# Patient Record
Sex: Female | Born: 1960 | Race: White | Hispanic: No | Marital: Married | Smoking: Never smoker
Health system: Southern US, Community
[De-identification: ages and names within clinical notes are randomized; demographics above are authoritative.]

## PROBLEM LIST (undated history)

## (undated) DIAGNOSIS — N189 Chronic kidney disease, unspecified: Secondary | ICD-10-CM

## (undated) DIAGNOSIS — I10 Essential (primary) hypertension: Secondary | ICD-10-CM

## (undated) HISTORY — PX: ABDOMINAL HYSTERECTOMY: SHX81

## (undated) HISTORY — DX: Chronic kidney disease, unspecified: N18.9

## (undated) HISTORY — PX: TONSILLECTOMY: SUR1361

---

## 1998-08-15 ENCOUNTER — Encounter: Payer: Self-pay | Admitting: Emergency Medicine

## 1998-08-15 ENCOUNTER — Emergency Department (HOSPITAL_COMMUNITY): Admission: EM | Admit: 1998-08-15 | Discharge: 1998-08-15 | Payer: Self-pay | Admitting: Emergency Medicine

## 2001-01-07 ENCOUNTER — Other Ambulatory Visit: Admission: RE | Admit: 2001-01-07 | Discharge: 2001-01-07 | Payer: Self-pay | Admitting: *Deleted

## 2001-01-17 ENCOUNTER — Encounter (INDEPENDENT_AMBULATORY_CARE_PROVIDER_SITE_OTHER): Payer: Self-pay | Admitting: Specialist

## 2001-01-17 ENCOUNTER — Other Ambulatory Visit: Admission: RE | Admit: 2001-01-17 | Discharge: 2001-01-17 | Payer: Self-pay | Admitting: *Deleted

## 2001-05-10 ENCOUNTER — Ambulatory Visit (HOSPITAL_COMMUNITY): Admission: RE | Admit: 2001-05-10 | Discharge: 2001-05-10 | Payer: Self-pay | Admitting: Unknown Physician Specialty

## 2001-07-04 ENCOUNTER — Emergency Department (HOSPITAL_COMMUNITY): Admission: EM | Admit: 2001-07-04 | Discharge: 2001-07-04 | Payer: Self-pay | Admitting: Emergency Medicine

## 2001-07-23 ENCOUNTER — Ambulatory Visit (HOSPITAL_COMMUNITY): Admission: RE | Admit: 2001-07-23 | Discharge: 2001-07-23 | Payer: Self-pay | Admitting: *Deleted

## 2001-08-21 HISTORY — PX: CATARACT EXTRACTION, BILATERAL: SHX1313

## 2002-11-04 ENCOUNTER — Emergency Department (HOSPITAL_COMMUNITY): Admission: EM | Admit: 2002-11-04 | Discharge: 2002-11-04 | Payer: Self-pay | Admitting: Emergency Medicine

## 2002-11-04 ENCOUNTER — Encounter: Payer: Self-pay | Admitting: Emergency Medicine

## 2003-09-23 ENCOUNTER — Encounter: Admission: RE | Admit: 2003-09-23 | Discharge: 2003-09-23 | Payer: Self-pay | Admitting: Internal Medicine

## 2004-09-30 ENCOUNTER — Encounter: Admission: RE | Admit: 2004-09-30 | Discharge: 2004-09-30 | Payer: Self-pay | Admitting: Internal Medicine

## 2005-12-01 ENCOUNTER — Ambulatory Visit: Payer: Self-pay | Admitting: Internal Medicine

## 2005-12-25 ENCOUNTER — Ambulatory Visit: Payer: Self-pay | Admitting: Internal Medicine

## 2005-12-27 ENCOUNTER — Ambulatory Visit: Payer: Self-pay | Admitting: Cardiology

## 2009-05-08 ENCOUNTER — Emergency Department (HOSPITAL_COMMUNITY): Admission: EM | Admit: 2009-05-08 | Discharge: 2009-05-08 | Payer: Self-pay | Admitting: Emergency Medicine

## 2009-09-16 ENCOUNTER — Encounter: Admission: RE | Admit: 2009-09-16 | Discharge: 2009-09-16 | Payer: Self-pay | Admitting: Internal Medicine

## 2010-04-07 ENCOUNTER — Encounter: Admission: RE | Admit: 2010-04-07 | Discharge: 2010-04-07 | Payer: Self-pay | Admitting: Internal Medicine

## 2010-06-29 ENCOUNTER — Ambulatory Visit (HOSPITAL_BASED_OUTPATIENT_CLINIC_OR_DEPARTMENT_OTHER): Admission: RE | Admit: 2010-06-29 | Discharge: 2010-06-29 | Payer: Self-pay | Admitting: Orthopedic Surgery

## 2010-08-08 ENCOUNTER — Ambulatory Visit (HOSPITAL_COMMUNITY)
Admission: RE | Admit: 2010-08-08 | Discharge: 2010-08-08 | Payer: Self-pay | Source: Home / Self Care | Attending: Gastroenterology | Admitting: Gastroenterology

## 2010-08-21 HISTORY — PX: KNEE SURGERY: SHX244

## 2010-10-31 LAB — PROTIME-INR: Prothrombin Time: 13.7 seconds (ref 11.6–15.2)

## 2010-10-31 LAB — CBC
HCT: 35 % — ABNORMAL LOW (ref 36.0–46.0)
Hemoglobin: 11.7 g/dL — ABNORMAL LOW (ref 12.0–15.0)
MCH: 30.2 pg (ref 26.0–34.0)
MCHC: 33.4 g/dL (ref 30.0–36.0)
MCV: 90.2 fL (ref 78.0–100.0)

## 2010-11-01 LAB — POCT HEMOGLOBIN-HEMACUE: Hemoglobin: 12.4 g/dL (ref 12.0–15.0)

## 2011-01-06 NOTE — Procedures (Signed)
. [facility]  Patient:    [name], Crystal Green Visit Number: [id number] MRN: [id number]          Service Type: CAT Location: [facility] 2855 [id number] Attending Physician:  Darlin Priestly Dictated by:   Lenise Herald, M.D. Proc. Date: 07/23/01 Admit Date:  07/23/2001   CC:         Delrae Rend, M.D.   Procedure Report  PROCEDURE:  Heads-up tilt-table testing.  COMPLICATIONS:  None.  INDICATIONS:  Ms. Crosley is a 50 year old female patient of Dr. Yates Decamp with recurrent presyncope.  She is now referred for a tilt-table test to rule out vasodepressor syncope.  DESCRIPTION OF PROCEDURE:  After giving informed written consent, the patient was brought to the EP lab in a fasting state.  The patient then underwent eight minutes of blood pressure and heart rate monitoring with a resting blood pressure of 136/71 and a resting heart rate of 81.  This remained stable.  The patient was then tilted to a heads-up position at 70 degrees.  She remained hemodynamically stable with blood pressures ranging from 120 to 140 and heart rates running from the upper 80s to 122.  The patient did complain of intermittent episodes of nausea and mild lightheaded; however, she had no symptoms which were reminiscent of her prior presyncope.  The patient was then again placed in the supine position with hemodynamic monitoring which was essentially unchanged.  Isuprel infusion was then begun at 15 cc.  After approximately five minute of Isuprel infusion, the patient was then heads-up tilted to 70 degrees.  Isuprel was increased to 18 cc.  Approximately eight minutes into the heads-up portion, the patients blood pressure dropped to 90/60.  Heart rate dropped from 122 to 67.  The patient became pale and nauseated and felt presyncope.  The patient was then immediately returned to a supine position.  Isuprel was discontinued and her blood pressure returned to 123/49 with a heart rate of 82.   The patient then was recovered without incident.  CONCLUSIONS:  A positive Isuprel-induced heads-up tilt-table testing. Dictated by:   Lenise Herald, M.D. Attending Physician:  Darlin Priestly DD:  07/23/01 TD:  07/23/01 Job: [id number]

## 2011-12-01 ENCOUNTER — Observation Stay (HOSPITAL_COMMUNITY)
Admission: EM | Admit: 2011-12-01 | Discharge: 2011-12-03 | Disposition: A | Discharge status: Home / Self Care | Payer: BC Managed Care – PPO | Attending: General Surgery | Admitting: General Surgery

## 2011-12-01 ENCOUNTER — Emergency Department (HOSPITAL_COMMUNITY): Payer: BC Managed Care – PPO

## 2011-12-01 ENCOUNTER — Encounter (HOSPITAL_COMMUNITY): Payer: Self-pay | Admitting: *Deleted

## 2011-12-01 DIAGNOSIS — R1031 Right lower quadrant pain: Secondary | ICD-10-CM | POA: Insufficient documentation

## 2011-12-01 DIAGNOSIS — R197 Diarrhea, unspecified: Secondary | ICD-10-CM | POA: Insufficient documentation

## 2011-12-01 DIAGNOSIS — R110 Nausea: Secondary | ICD-10-CM | POA: Insufficient documentation

## 2011-12-01 DIAGNOSIS — K3580 Unspecified acute appendicitis: Principal | ICD-10-CM | POA: Insufficient documentation

## 2011-12-01 DIAGNOSIS — I10 Essential (primary) hypertension: Secondary | ICD-10-CM | POA: Insufficient documentation

## 2011-12-01 HISTORY — DX: Essential (primary) hypertension: I10

## 2011-12-01 LAB — LIPASE, BLOOD: Lipase: 19 U/L (ref 11–59)

## 2011-12-01 LAB — BASIC METABOLIC PANEL
Chloride: 104 mEq/L (ref 96–112)
GFR calc Af Amer: >90 mL/min (ref >90)
Potassium: 3.9 mEq/L (ref 3.5–5.1)

## 2011-12-01 LAB — URINALYSIS, ROUTINE W REFLEX MICROSCOPIC
Ketones, ur: 15 mg/dL — AB
Leukocytes, UA: NEGATIVE
Nitrite: NEGATIVE
Urobilinogen, UA: 0.2 mg/dL (ref 0.0–1.0)
pH: 5 (ref 5.0–8.0)

## 2011-12-01 LAB — CBC
MCH: 29.9 pg (ref 26.0–34.0)
MCV: 89.5 fL (ref 78.0–100.0)
WBC: 13.4 10*3/uL — ABNORMAL HIGH (ref 4.0–10.5)

## 2011-12-01 LAB — HEPATIC FUNCTION PANEL
AST: 20 U/L (ref 0–37)
Albumin: 4.1 g/dL (ref 3.5–5.2)
Total Bilirubin: 1 mg/dL (ref 0.3–1.2)

## 2011-12-01 MED ORDER — SODIUM CHLORIDE 0.9 % IV SOLN
Freq: Once | INTRAVENOUS | Status: AC
  Administered 2011-12-01: 20:00:00 via INTRAVENOUS

## 2011-12-01 MED ORDER — IOHEXOL 300 MG/ML  SOLN
20.0000 mL | INTRAMUSCULAR | Status: AC
  Administered 2011-12-01: 20 mL via ORAL

## 2011-12-01 MED ORDER — SODIUM CHLORIDE 0.9 % IV SOLN
1.0000 g | Freq: Once | INTRAVENOUS | Status: AC
  Administered 2011-12-02: 1 g via INTRAVENOUS
  Filled 2011-12-01: qty 1

## 2011-12-01 MED ORDER — ONDANSETRON HCL 4 MG/2ML IJ SOLN
4.0000 mg | Freq: Once | INTRAMUSCULAR | Status: AC
  Administered 2011-12-01: 4 mg via INTRAVENOUS
  Filled 2011-12-01: qty 2

## 2011-12-01 MED ORDER — HYDROMORPHONE HCL PF 1 MG/ML IJ SOLN
1.0000 mg | Freq: Once | INTRAMUSCULAR | Status: AC
  Administered 2011-12-01: 0.75 mg via INTRAVENOUS
  Filled 2011-12-01: qty 1

## 2011-12-01 MED ORDER — IOHEXOL 300 MG/ML  SOLN
100.0000 mL | Freq: Once | INTRAMUSCULAR | Status: AC | PRN
  Administered 2011-12-01: 100 mL via INTRAVENOUS

## 2011-12-01 NOTE — ED Provider Notes (Signed)
History     CSN: [id number]  Arrival date & time 12/01/11  1441   First MD Initiated Contact with Patient 12/01/11 1929      Chief Complaint  Patient presents with  . Abdominal Pain  . Nausea  . Diarrhea    (Consider location/radiation/quality/duration/timing/severity/associated sxs/prior treatment) Patient is a 51 y.o. female presenting with abdominal pain and diarrhea. The history is provided by the patient (The patient states she started with diarrhea on Wednesday was continued on Thursday and today she started having some abdominal pain no fevers chills some nausea the pain is worse in the right lower quadrant). No language interpreter was used.  Abdominal Pain The primary symptoms of the illness include abdominal pain and diarrhea. The primary symptoms of the illness do not include fatigue. The current episode started 2 days ago. The onset of the illness was gradual. The problem has not changed since onset. Associated with: none. The patient states that she believes she is currently not pregnant. The patient has not had a change in bowel habit. Additional symptoms associated with the illness include chills. Symptoms associated with the illness do not include hematuria, frequency or back pain. Significant associated medical issues do not include PUD, inflammatory bowel disease or sickle cell disease.  Diarrhea The primary symptoms include abdominal pain and diarrhea. Primary symptoms do not include fatigue or rash. The illness began 2 days ago. The problem has not changed since onset. The illness is also significant for chills. The illness does not include back pain. Associated medical issues do not include inflammatory bowel disease or PUD.    Past Medical History  Diagnosis Date  . Hypertension     Past Surgical History  Procedure Date  . Abdominal hysterectomy     History reviewed. No pertinent family history.  History  Substance Use Topics  . Smoking status: Never  Smoker   . Smokeless tobacco: Not on file  . Alcohol Use: No    OB History    Grav Para Term Preterm Abortions TAB SAB Ect Mult Living                  Review of Systems  Constitutional: Positive for chills. Negative for fatigue.  HENT: Negative for congestion, sinus pressure and ear discharge.   Eyes: Negative for discharge.  Respiratory: Negative for cough.   Cardiovascular: Negative for chest pain.  Gastrointestinal: Positive for abdominal pain and diarrhea.  Genitourinary: Negative for frequency and hematuria.  Musculoskeletal: Negative for back pain.  Skin: Negative for rash.  Neurological: Negative for seizures and headaches.  Hematological: Negative.   Psychiatric/Behavioral: Negative for hallucinations.    Allergies  Codeine; Sudafed; and Amoxicillin  Home Medications   Current Outpatient Rx  Name Route Sig Dispense Refill  . CLARITIN PO Oral Take 1 tablet by mouth daily as needed. For allergies childrens claritin    . METOPROLOL SUCCINATE ER 25 MG PO TB24 Oral Take 37.5 mg by mouth daily.      BP 128/83  Pulse 78  Temp(Src) 98.6 F (37 C) (Oral)  Resp 16  SpO2 96%  Physical Exam  Constitutional: She is oriented to person, place, and time. She appears well-developed.  HENT:  Head: Normocephalic and atraumatic.  Eyes: Conjunctivae and EOM are normal. No scleral icterus.  Neck: Neck supple. No thyromegaly present.  Cardiovascular: Normal rate and regular rhythm.  Exam reveals no gallop and no friction rub.   No murmur heard. Pulmonary/Chest: No stridor. She has no wheezes.  She has no rales. She exhibits no tenderness.  Abdominal: She exhibits no distension. There is tenderness. There is no rebound.       Tender rlq  Musculoskeletal: Normal range of motion. She exhibits no edema.  Lymphadenopathy:    She has no cervical adenopathy.  Neurological: She is oriented to person, place, and time. Coordination normal.  Skin: No rash noted. No erythema.    Psychiatric: She has a normal mood and affect. Her behavior is normal.    ED Course  Procedures (including critical care time)  Labs Reviewed  BASIC METABOLIC PANEL - Abnormal; Notable for the following:    Glucose, Bld 122 (*)    All other components within normal limits  CBC - Abnormal; Notable for the following:    WBC 13.4 (*)    All other components within normal limits  URINALYSIS, ROUTINE W REFLEX MICROSCOPIC - Abnormal; Notable for the following:    Color, Urine AMBER (*) BIOCHEMICALS MAY BE AFFECTED BY COLOR   APPearance CLOUDY (*)    Ketones, ur 15 (*)    All other components within normal limits  LIPASE, BLOOD   No results found.   No diagnosis found.    MDM          Benny Lennert, MD 12/01/11 [phone]

## 2011-12-01 NOTE — ED Provider Notes (Signed)
She has been ill today with pain in the right lower abdomen, it worsened when she exercised on a treadmill. She's had decreased appetite and malaise for 3 days. She's had some fever and chills, but no documented fever. She has not had similar problem. 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 She has had a transvaginal hysterectomy. Abdomen is soft with mild right lower quadrant tenderness.  Diagnosis: uncomplicated appendicitis.  Consult general surgery for, admission and appendectomy   Medical screening examination/treatment/procedure(s) were conducted as a shared visit with non-physician practitioner(s) and myself.  I personally evaluated the patient during the encounter  Flint Melter, MD 12/02/11 [phone]

## 2011-12-01 NOTE — ED Provider Notes (Signed)
History     CSN: [id number]  Arrival date & time 12/01/11  1441   First MD Initiated Contact with Patient 12/01/11 1929      Chief Complaint  Patient presents with  . Abdominal Pain  . Nausea  . Diarrhea    (Consider location/radiation/quality/duration/timing/severity/associated sxs/prior treatment) HPI  Past Medical History  Diagnosis Date  . Hypertension     Past Surgical History  Procedure Date  . Abdominal hysterectomy     History reviewed. No pertinent family history.  History  Substance Use Topics  . Smoking status: Never Smoker   . Smokeless tobacco: Not on file  . Alcohol Use: No    OB History    Grav Para Term Preterm Abortions TAB SAB Ect Mult Living                  Review of Systems  Allergies  Codeine; Sudafed; and Amoxicillin  Home Medications   Current Outpatient Rx  Name Route Sig Dispense Refill  . CLARITIN PO Oral Take 1 tablet by mouth daily as needed. For allergies childrens claritin    . METOPROLOL SUCCINATE ER 25 MG PO TB24 Oral Take 37.5 mg by mouth daily.      BP 128/83  Pulse 78  Temp(Src) 98.6 F (37 C) (Oral)  Resp 16  SpO2 96%  Physical Exam  ED Course  Procedures (including critical care time)  Labs Reviewed  BASIC METABOLIC PANEL - Abnormal; Notable for the following:    Glucose, Bld 122 (*)    All other components within normal limits  CBC - Abnormal; Notable for the following:    WBC 13.4 (*)    All other components within normal limits  URINALYSIS, ROUTINE W REFLEX MICROSCOPIC - Abnormal; Notable for the following:    Color, Urine AMBER (*) BIOCHEMICALS MAY BE AFFECTED BY COLOR   APPearance CLOUDY (*)    Ketones, ur 15 (*)    All other components within normal limits  LIPASE, BLOOD  HEPATIC FUNCTION PANEL   Ct Abdomen Pelvis W Contrast  12/01/2011  *RADIOLOGY REPORT*  Clinical Data: Abdominal pain.  Nausea.  Diarrhea.  CT ABDOMEN AND PELVIS WITH CONTRAST  Technique:  Multidetector CT imaging of the  abdomen and pelvis was performed following the standard protocol during bolus administration of intravenous contrast.  Contrast: OMNIPAQUE IOHEXOL 300 MG/ML  SOLN  Comparison: None.  Findings: The abdominal parenchymal organs are normal appearance. Prior hysterectomy noted.  No masses or lymphadenopathy identified within the abdomen or pelvis.  Enlarged of the appendix is seen with mild periappendiceal inflammatory changes.  This consistent with acute appendicitis. There is no evidence of abscess or free fluid.  No evidence of bowel obstruction or other inflammatory process.  IMPRESSION:  1.  Positive for acute appendicitis. 2.  No evidence of abscess or other complication.  Original Report Authenticated By: Danae Orleans, M.D.     No diagnosis found.    MDM  I have taken over care of the patient from Dr. Estell Harpin - please see his note for HPI, ROS and PE - patient with acute appendicitis - Dr. Effie Shy in to speak with the patient and he will call general surgery, Patient with a history of elevated LFT's, so we have ordered these as well.        Crystal Green, [place] 12/01/11 [phone]

## 2011-12-01 NOTE — ED Notes (Signed)
Pt states started having abdominal pain with nausea and diarrhea on wednesday.Pt points to upper abdomen and lower abdomen. Pt was sent to ED from urgent care on [address] for further eval.

## 2011-12-02 ENCOUNTER — Emergency Department (HOSPITAL_COMMUNITY): Payer: BC Managed Care – PPO | Admitting: Certified Registered"

## 2011-12-02 ENCOUNTER — Encounter (HOSPITAL_COMMUNITY): Payer: Self-pay | Admitting: Certified Registered"

## 2011-12-02 ENCOUNTER — Encounter (HOSPITAL_COMMUNITY)
Admission: EM | Disposition: A | Discharge status: Home / Self Care | Payer: Self-pay | Source: Home / Self Care | Attending: Emergency Medicine

## 2011-12-02 DIAGNOSIS — K3580 Unspecified acute appendicitis: Secondary | ICD-10-CM | POA: Diagnosis present

## 2011-12-02 HISTORY — PX: LAPAROSCOPIC APPENDECTOMY: SHX408

## 2011-12-02 SURGERY — APPENDECTOMY, LAPAROSCOPIC
Anesthesia: General | Site: Abdomen | Wound class: Contaminated

## 2011-12-02 MED ORDER — HYDROMORPHONE HCL PF 1 MG/ML IJ SOLN
1.0000 mg | Freq: Once | INTRAMUSCULAR | Status: AC
  Administered 2011-12-02: 1 mg via INTRAVENOUS
  Filled 2011-12-02: qty 1

## 2011-12-02 MED ORDER — SODIUM CHLORIDE 0.9 % IR SOLN
Status: DC | PRN
  Administered 2011-12-02: 1000 mL

## 2011-12-02 MED ORDER — ONDANSETRON HCL 4 MG/2ML IJ SOLN: 4.0000 mg | Freq: Once | INTRAMUSCULAR | Status: DC | PRN

## 2011-12-02 MED ORDER — METOCLOPRAMIDE HCL 5 MG/ML IJ SOLN
INTRAMUSCULAR | Status: DC | PRN
  Administered 2011-12-02: 10 mg via INTRAVENOUS

## 2011-12-02 MED ORDER — SUCCINYLCHOLINE CHLORIDE 20 MG/ML IJ SOLN
INTRAMUSCULAR | Status: DC | PRN
  Administered 2011-12-02: 100 mg via INTRAVENOUS

## 2011-12-02 MED ORDER — ACETAMINOPHEN 650 MG RE SUPP: 650.0000 mg | Freq: Four times a day (QID) | RECTAL | Status: DC | PRN

## 2011-12-02 MED ORDER — DROPERIDOL 2.5 MG/ML IJ SOLN
INTRAMUSCULAR | Status: DC | PRN
  Administered 2011-12-02: 0.625 mg via INTRAVENOUS

## 2011-12-02 MED ORDER — OXYCODONE HCL 5 MG PO TABS
5.0000 mg | ORAL_TABLET | ORAL | Status: DC | PRN
  Administered 2011-12-02: 5 mg via ORAL
  Filled 2011-12-02: qty 1

## 2011-12-02 MED ORDER — LORATADINE 10 MG PO TABS
10.0000 mg | ORAL_TABLET | Freq: Every day | ORAL | Status: DC
  Filled 2011-12-02 (×2): qty 1

## 2011-12-02 MED ORDER — SODIUM CHLORIDE 0.9 % IV SOLN
INTRAVENOUS | Status: DC
  Administered 2011-12-02 (×2): via INTRAVENOUS

## 2011-12-02 MED ORDER — 0.9 % SODIUM CHLORIDE (POUR BTL) OPTIME
TOPICAL | Status: DC | PRN
  Administered 2011-12-02: 1000 mL

## 2011-12-02 MED ORDER — FENTANYL CITRATE 0.05 MG/ML IJ SOLN
INTRAMUSCULAR | Status: DC | PRN
  Administered 2011-12-02: 50 ug via INTRAVENOUS
  Administered 2011-12-02 (×2): 100 ug via INTRAVENOUS

## 2011-12-02 MED ORDER — LACTATED RINGERS IV SOLN
INTRAVENOUS | Status: DC | PRN
  Administered 2011-12-02: 02:00:00 via INTRAVENOUS

## 2011-12-02 MED ORDER — ACETAMINOPHEN 325 MG PO TABS: 650.0000 mg | ORAL_TABLET | Freq: Four times a day (QID) | ORAL | Status: DC | PRN

## 2011-12-02 MED ORDER — ONDANSETRON HCL 4 MG/2ML IJ SOLN
4.0000 mg | Freq: Once | INTRAMUSCULAR | Status: AC
  Administered 2011-12-02: 4 mg via INTRAVENOUS
  Filled 2011-12-02: qty 2

## 2011-12-02 MED ORDER — MIDAZOLAM HCL 5 MG/5ML IJ SOLN
INTRAMUSCULAR | Status: DC | PRN
  Administered 2011-12-02: 2 mg via INTRAVENOUS

## 2011-12-02 MED ORDER — METOPROLOL SUCCINATE ER 25 MG PO TB24
37.5000 mg | ORAL_TABLET | Freq: Every day | ORAL | Status: DC
  Administered 2011-12-02: 37.5 mg via ORAL
  Filled 2011-12-02 (×2): qty 1

## 2011-12-02 MED ORDER — PROPOFOL 10 MG/ML IV EMUL
INTRAVENOUS | Status: DC | PRN
  Administered 2011-12-02: 200 mg via INTRAVENOUS
  Administered 2011-12-02: 100 mg via INTRAVENOUS

## 2011-12-02 MED ORDER — HYDROCODONE-ACETAMINOPHEN 5-325 MG PO TABS: 1.0000 | ORAL_TABLET | ORAL | Status: DC | PRN

## 2011-12-02 MED ORDER — ONDANSETRON HCL 4 MG/2ML IJ SOLN
INTRAMUSCULAR | Status: DC | PRN
  Administered 2011-12-02: 4 mg via INTRAVENOUS

## 2011-12-02 MED ORDER — HYDROMORPHONE HCL PF 1 MG/ML IJ SOLN: 0.2500 mg | INTRAMUSCULAR | Status: DC | PRN

## 2011-12-02 MED ORDER — MORPHINE SULFATE 2 MG/ML IJ SOLN: 0.0500 mg/kg | INTRAMUSCULAR | Status: DC | PRN

## 2011-12-02 MED ORDER — ONDANSETRON HCL 4 MG/2ML IJ SOLN: 4.0000 mg | Freq: Four times a day (QID) | INTRAMUSCULAR | Status: DC | PRN

## 2011-12-02 MED ORDER — MORPHINE SULFATE 2 MG/ML IJ SOLN
2.0000 mg | INTRAMUSCULAR | Status: DC | PRN
  Administered 2011-12-02: 2 mg via INTRAVENOUS
  Filled 2011-12-02 (×2): qty 1

## 2011-12-02 MED ORDER — BUPIVACAINE-EPINEPHRINE 0.25% -1:200000 IJ SOLN
INTRAMUSCULAR | Status: DC | PRN
  Administered 2011-12-02: 1 mL

## 2011-12-02 MED ORDER — OXYCODONE HCL 5 MG PO TABS
5.0000 mg | ORAL_TABLET | ORAL | Status: DC | PRN
  Administered 2011-12-02 – 2011-12-03 (×4): 5 mg via ORAL
  Filled 2011-12-02: qty 2
  Filled 2011-12-02 (×3): qty 1

## 2011-12-02 SURGICAL SUPPLY — 45 items
APPLIER CLIP ROT 10 11.4 M/L (STAPLE)
CANISTER SUCTION 2500CC (MISCELLANEOUS) ×2 IMPLANT
CHLORAPREP W/TINT 26ML (MISCELLANEOUS) ×2 IMPLANT
CLIP APPLIE ROT 10 11.4 M/L (STAPLE) IMPLANT
CLOTH BEACON ORANGE TIMEOUT ST (SAFETY) ×2 IMPLANT
COVER SURGICAL LIGHT HANDLE (MISCELLANEOUS) ×2 IMPLANT
CUTTER FLEX LINEAR 45M (STAPLE) ×2 IMPLANT
DERMABOND ADVANCED (GAUZE/BANDAGES/DRESSINGS) ×1
DERMABOND ADVANCED .7 DNX12 (GAUZE/BANDAGES/DRESSINGS) ×1 IMPLANT
ELECT REM PT RETURN 9FT ADLT (ELECTROSURGICAL) ×2
ELECTRODE REM PT RTRN 9FT ADLT (ELECTROSURGICAL) ×1 IMPLANT
GLOVE BIO SURGEON STRL SZ 6.5 (GLOVE) ×2 IMPLANT
GLOVE BIO SURGEON STRL SZ7 (GLOVE) ×2 IMPLANT
GLOVE BIOGEL PI IND STRL 6.5 (GLOVE) ×1 IMPLANT
GLOVE BIOGEL PI IND STRL 7.5 (GLOVE) ×1 IMPLANT
GLOVE BIOGEL PI INDICATOR 6.5 (GLOVE) ×1
GLOVE BIOGEL PI INDICATOR 7.5 (GLOVE) ×1
GLOVE SS BIOGEL STRL SZ 7 (GLOVE) ×1 IMPLANT
GLOVE SUPERSENSE BIOGEL SZ 7 (GLOVE) ×1
GOWN STRL NON-REIN LRG LVL3 (GOWN DISPOSABLE) ×4 IMPLANT
KIT BASIN OR (CUSTOM PROCEDURE TRAY) ×2 IMPLANT
KIT ROOM TURNOVER OR (KITS) ×2 IMPLANT
NS IRRIG 1000ML POUR BTL (IV SOLUTION) ×2 IMPLANT
PAD ARMBOARD 7.5X6 YLW CONV (MISCELLANEOUS) ×4 IMPLANT
POUCH SPECIMEN RETRIEVAL 10MM (ENDOMECHANICALS) ×2 IMPLANT
RELOAD 45 VASCULAR/THIN (ENDOMECHANICALS) ×2 IMPLANT
RELOAD STAPLE TA45 3.5 REG BLU (ENDOMECHANICALS) IMPLANT
SCALPEL HARMONIC ACE (MISCELLANEOUS) ×2 IMPLANT
SCISSORS LAP 5X35 DISP (ENDOMECHANICALS) IMPLANT
SET IRRIG TUBING LAPAROSCOPIC (IRRIGATION / IRRIGATOR) ×2 IMPLANT
SLEEVE ENDOPATH XCEL 5M (ENDOMECHANICALS) ×2 IMPLANT
SPECIMEN JAR MEDIUM (MISCELLANEOUS) ×2 IMPLANT
SPECIMEN JAR SMALL (MISCELLANEOUS) IMPLANT
SPONGE LAP 18X18 X RAY DECT (DISPOSABLE) ×2 IMPLANT
SUT MNCRL AB 4-0 PS2 18 (SUTURE) ×2 IMPLANT
SUT VICRYL 0 UR6 27IN ABS (SUTURE) ×2 IMPLANT
TOWEL OR 17X24 6PK STRL BLUE (TOWEL DISPOSABLE) ×2 IMPLANT
TOWEL OR 17X26 10 PK STRL BLUE (TOWEL DISPOSABLE) ×2 IMPLANT
TRAY FOLEY CATH 14FR (SET/KITS/TRAYS/PACK) ×2 IMPLANT
TRAY LAPAROSCOPIC (CUSTOM PROCEDURE TRAY) ×2 IMPLANT
TROCAR XCEL 12X100 BLDLESS (ENDOMECHANICALS) IMPLANT
TROCAR XCEL BLADELESS 5X75MML (TROCAR) ×2 IMPLANT
TROCAR XCEL BLUNT TIP 100MML (ENDOMECHANICALS) ×2 IMPLANT
TROCAR XCEL NON-BLD 11X100MML (ENDOMECHANICALS) IMPLANT
TROCAR XCEL NON-BLD 5MMX100MML (ENDOMECHANICALS) ×2 IMPLANT

## 2011-12-02 NOTE — Op Note (Signed)
Preoperative diagnosis: Acute appendicitis Postoperative diagnosis: Acute suppurative appendicitis Procedure: Laparoscopic appendectomy Surgeon: Dr. Harden Mo Anesthesia: Gen. Specimens: Appendix to pathology Complications: None Estimated blood loss: Minimal Drains: None Sponge and needle count correct x2 at end of operation Disposition the patient to recovery in stable condition  Indications: This is a 51 year old female with a 48-hour history of right lower quadrant abdominal pain associated with an elevated white blood cell count, localized peritoneal signs on exam and a right lower quadrant, and a CT scan consistent with acute appendicitis. I discussed laparoscopic appendectomy and the risks and benefits associated with that.  Procedure: After informed consent was the patient was taken to improve. She was administered 1 g of intravenous invanz. Sequential compression devices were placed her legs prior to induction of anesthesia. She was placed under general endotracheal anesthesia without complication. A Foley catheter was then placed. Her abdomen was prepped and draped in the standard sterile surgical fashion. Surgical timeout was performed.  I injected quarter percent Marcaine just below her umbilicus. A  vertical incision was made and I carried this out to her fascia. I incised the fascia sharply and entered the peritoneum bluntly. I placed a 0 Vicryl purse string suture through the fascia. A Hassan trocar was then introduced and the abdomen was then insufflated to 15 mmHg pressure. I then placed 2 additional 5 mm ports after infiltration with local anesthetic under direct vision without complication in the suprapubic region and left lower quadrant. I then dissected over to the right lower quadrant. Her appendix was noted to be acutely suppurative.It was here adherent to her terminal ileum freed this with blunt dissection. I then released the white line of Toldt up the little bit of the  cecum to release the appendix. I did this with the Harmonic scalpel. I then began to divide the mesentery of the appendix with harmonic scalpel. Eventually I was able to encircle the base with the [facility]. I then stapled across the base. This was hemostatic and clean viable tissue. I then used a Harmonic scalpel to remove the remaining attachments of the appendix. This was placed in an Endo Catch bag and removed from the umbilicus. Irrigation was performed. Hemostasis was observed. I then room a Hassan trocar placed an additional figure-of-eight 0 Vicryl suture through this defect and this completely obliterated. I then desufflated the abdomen and removed the remaining trocars. I closed the incisions with 4-0 Monocryl and Dermabond. She tolerated this well her Foley was removed and was extubated in the operating room. She was transferred to recovery in stable condition.

## 2011-12-02 NOTE — ED Provider Notes (Signed)
Medical screening examination/treatment/procedure(s) were conducted as a shared visit with non-physician practitioner(s) and myself.  I personally evaluated the patient during the encounter  Flint Melter, MD 12/02/11 [phone]

## 2011-12-02 NOTE — Anesthesia Postprocedure Evaluation (Signed)
  Anesthesia Post-op Note  Patient: Crystal Green  Procedure(s) Performed: Procedure(s) (LRB): APPENDECTOMY LAPAROSCOPIC (N/A)  Patient Location: PACU  Anesthesia Type: General  Level of Consciousness: awake, alert  and oriented  Airway and Oxygen Therapy: Patient Spontanous Breathing  Post-op Pain: mild  Post-op Assessment: Post-op Vital signs reviewed  Post-op Vital Signs: Reviewed  Complications: No apparent anesthesia complications

## 2011-12-02 NOTE — ED Notes (Signed)
Assumed care of pt.  Pt noted to ambulate to bathroom without difficulty.  Reporting slight pain in her RLQ.  No distress noted.  Family remains at bedside-awaiting surgery consult.

## 2011-12-02 NOTE — Anesthesia Procedure Notes (Signed)
Procedure Name: Intubation Date/Time: 12/02/2011 2:41 AM Performed by: Rossie Muskrat L Pre-anesthesia Checklist: Patient identified, Timeout performed, Emergency Drugs available, Suction available and Patient being monitored Patient Re-evaluated:Patient Re-evaluated prior to inductionOxygen Delivery Method: Circle system utilized Preoxygenation: Pre-oxygenation with 100% oxygen Intubation Type: IV induction Ventilation: Mask ventilation without difficulty Laryngoscope Size: Miller and 2 Grade View: Grade I Tube type: Oral Tube size: 7.5 mm Number of attempts: 1 Airway Equipment and Method: Stylet Placement Confirmation: ETT inserted through vocal cords under direct vision,  breath sounds checked- equal and bilateral and positive ETCO2 Secured at: 22 cm Tube secured with: Tape Dental Injury: Teeth and Oropharynx as per pre-operative assessment

## 2011-12-02 NOTE — Progress Notes (Signed)
Day of Surgery  Subjective: S/p lap appy Sleepy, but no serious complaints No nausea, tolerating clears  Objective: Vital signs in last 24 hours: Temp:  [97.9 F (36.6 C)-98.9 F (37.2 C)] 98.6 F (37 C) (04/13 1020) Pulse Rate:  [72-82] 82  (04/13 1020) Resp:  [16-18] 16  (04/13 1020) BP: (127-142)/(68-88) 127/68 mmHg (04/13 1020) SpO2:  [96 %-100 %] 100 % (04/13 1020) Weight:  [239 lb 3.2 oz (108.5 kg)] 239 lb 3.2 oz (108.5 kg) (04/13 0500) Last BM Date: 11/30/11  Intake/Output from previous day: 04/12 0701 - 04/13 0700 In: 1000 [I.V.:1000] Out: 300 [Urine:200; Blood:100] Intake/Output this shift: Total I/O In: 360 [P.O.:360] Out: 402 [Urine:402]  Abd - soft,incisions clean and dry; positive BS  Lab Results:   [facility] 12/01/11 1514  WBC 13.4*  HGB 12.8  HCT 38.3  PLT 208   BMET  Basename 12/01/11 1514  NA 140  K 3.9  CL 104  CO2 23  GLUCOSE 122*  BUN 15  CREATININE 0.79  CALCIUM 9.2   PT/INR No results found for this basename: LABPROT:2,INR:2 in the last 72 hours ABG No results found for this basename: PHART:2,PCO2:2,PO2:2,HCO3:2 in the last 72 hours  Studies/Results: Ct Abdomen Pelvis W Contrast  12/01/2011  *RADIOLOGY REPORT*  Clinical Data: Abdominal pain.  Nausea.  Diarrhea.  CT ABDOMEN AND PELVIS WITH CONTRAST  Technique:  Multidetector CT imaging of the abdomen and pelvis was performed following the standard protocol during bolus administration of intravenous contrast.  Contrast: OMNIPAQUE IOHEXOL 300 MG/ML  SOLN  Comparison: None.  Findings: The abdominal parenchymal organs are normal appearance. Prior hysterectomy noted.  No masses or lymphadenopathy identified within the abdomen or pelvis.  Enlarged of the appendix is seen with mild periappendiceal inflammatory changes.  This consistent with acute appendicitis. There is no evidence of abscess or free fluid.  No evidence of bowel obstruction or other inflammatory process.  IMPRESSION:  1.   Positive for acute appendicitis. 2.  No evidence of abscess or other complication.  Original Report Authenticated By: Danae Orleans, M.D.    Anti-infectives: Anti-infectives     Start     Dose/Rate Route Frequency Ordered Stop   12/01/11 2345   ertapenem (INVANZ) 1 g in sodium chloride 0.9 % 50 mL IVPB        1 g 100 mL/hr over 30 Minutes Intravenous  Once 12/01/11 2341 12/02/11 0127          Assessment/Plan: s/p Procedure(s) (LRB): APPENDECTOMY LAPAROSCOPIC (N/A) Plan for discharge tomorrow PO pain meds Advance diet   LOS: 1 day    Solina Heron K. 12/02/2011

## 2011-12-02 NOTE — Discharge Instructions (Signed)
[facility] LAPAROSCOPIC SURGERY: POST OP INSTRUCTIONS  Always review your discharge instruction sheet given to you by the facility where your surgery was performed. IF YOU HAVE DISABILITY OR FAMILY LEAVE FORMS, YOU MUST BRING THEM TO THE OFFICE FOR PROCESSING.   DO NOT GIVE THEM TO YOUR DOCTOR.  1. A prescription for pain medication may be given to you upon discharge.  Take your pain medication as prescribed, if needed.  If narcotic pain medicine is not needed, then you may take acetaminophen (Tylenol), naprosyn (Alleve), or ibuprofen (Advil) as needed. 2. Take your usually prescribed medications unless otherwise directed. 3. If you need a refill on your pain medication, please contact your pharmacy.  They will contact our office to request authorization. Prescriptions will not be filled after 5pm or on week-ends. 4. You should follow a light diet the first few days after arrival home, such as soup and crackers, etc.  Be sure to include lots of fluids daily. 5. Most patients will experience some swelling and bruising in the area of the incisions.  Ice packs will help.  Swelling and bruising can take several days to resolve.  6. It is common to experience some constipation if taking pain medication after surgery.  Increasing fluid intake and taking a stool softener (such as Colace) will usually help or prevent this problem from occurring.  A mild laxative (Milk of Magnesia or Miralax) should be taken according to package instructions if there are no bowel movements after 48 hours. 7. Unless discharge instructions indicate otherwise, you may remove your bandages 48 hours after surgery, and you may shower at that time.  You may have steri-strips (small skin tapes) in place directly over the incision.  These strips should be left on the skin for 7-10 days.  If your surgeon used skin glue on the incision, you may shower in 24 hours.  The glue will flake  off over the next 2-3 weeks.  Any sutures or staples will be removed at the office during your follow-up visit. 8. ACTIVITIES:  You may resume regular (light) daily activities beginning the next day--such as daily self-care, walking, climbing stairs--gradually increasing activities as tolerated.  You may have sexual intercourse when it is comfortable.  Refrain from any heavy lifting or straining until approved by your doctor. a. You may drive when you are no longer taking prescription pain medication, you can comfortably wear a seatbelt, and you can safely maneuver your car and apply brakes. b. RETURN TO WORK:  __________________________________________________________ 9. You should see your doctor in the office for a follow-up appointment approximately 2-3 weeks after your surgery.  Make sure that you call for this appointment within a day or two after you arrive home to insure a convenient appointment time. 10. OTHER INSTRUCTIONS: __________________________________________________________________________________________________________________________ __________________________________________________________________________________________________________________________ WHEN TO CALL YOUR DOCTOR: 1. Fever over 101.0 2. Inability to urinate 3. Continued bleeding from incision. 4. Increased pain, redness, or drainage from the incision. 5. Increasing abdominal pain  The clinic staff is available to answer your questions during regular business hours.  Please don't hesitate to call and ask to speak to one of the nurses for clinical concerns.  If you have a medical emergency, go to the nearest emergency room or call 911.  A surgeon from [facility] is always on call at the hospital. [address], [address], [place], [place]  [address] ? [address] [address], , [place]   [address] [phone] ? [phone] ? [phone]   [phone] Web site: [email or web address]  

## 2011-12-02 NOTE — Preoperative (Signed)
Beta Blockers   Reason not to administer Beta Blockers:Pt took b BLOCKER 12/01/11 @ 0800

## 2011-12-02 NOTE — H&P (Signed)
Crystal Green is an 51 y.o. female.   Chief Complaint: Referred by Dr. Mancel Bale HPI:  75 yof with 48 hour history of upper abdominal pain and rlq pain that is now mostly present in RLQ.  Denies fevers.  Has been having chills. Nauseated but no vomiting.  Having some diarrhea.  Pain is worsening and led her to come in today as she was getting no relief.  It is aggravated by movement.   Past Medical History  Diagnosis Date  . Hypertension   abnormal lfts being followed for now  Past Surgical History  Procedure Date  . Abdominal hysterectomy   this was actually a TVH by her report  History reviewed. No pertinent family history. Social History:  reports that she has never smoked. She does not have any smokeless tobacco history on file. She reports that she does not drink alcohol. Her drug history not on file.  Allergies:  Allergies  Allergen Reactions  . Codeine Other (See Comments)    jittery  . Sudafed (Pseudoephedrine) Other (See Comments)    jittery  . Amoxicillin Rash    Medications Prior to Admission  Medication Dose Route Frequency Provider Last Rate Last Dose  . 0.9 %  sodium chloride infusion   Intravenous Once Benny Lennert, MD 100 mL/hr at 12/01/11 2027    . ertapenem (INVANZ) 1 g in sodium chloride 0.9 % 50 mL IVPB  1 g Intravenous Once Flint Melter, MD      . HYDROmorphone (DILAUDID) injection 1 mg  1 mg Intravenous Once Benny Lennert, MD   0.75 mg at 12/01/11 2029  . HYDROmorphone (DILAUDID) injection 1 mg  1 mg Intravenous Once Scarlette Calico C. [place], [place]   1 mg at 12/02/11 0057  . iohexol (OMNIPAQUE) 300 MG/ML solution 100 mL  100 mL Intravenous Once PRN Benny Lennert, MD   100 mL at 12/01/11 2229  . iohexol (OMNIPAQUE) 300 MG/ML solution 20 mL  20 mL Oral Q1 Hr x 2 Benny Lennert, MD   20 mL at 12/01/11 2047  . ondansetron (ZOFRAN) injection 4 mg  4 mg Intravenous Once Benny Lennert, MD   4 mg at 12/01/11 2027  . ondansetron (ZOFRAN) injection 4 mg  4 mg  Intravenous Once Scarlette Calico C. [place], [place]   4 mg at 12/02/11 0057   No current outpatient prescriptions on file as of 12/01/2011.    Results for orders placed during the hospital encounter of 12/01/11 (from the past 48 hour(s))  BASIC METABOLIC PANEL     Status: Abnormal   Collection Time   12/01/11  3:14 PM      Component Value Range Comment   Sodium 140  135 - 145 (mEq/L)    Potassium 3.9  3.5 - 5.1 (mEq/L)    Chloride 104  96 - 112 (mEq/L)    CO2 23  19 - 32 (mEq/L)    Glucose, Bld 122 (*) 70 - 99 (mg/dL)    BUN 15  6 - 23 (mg/dL)    Creatinine, Ser 1.61  0.50 - 1.10 (mg/dL)    Calcium 9.2  8.4 - 10.5 (mg/dL)    GFR calc non Af Amer >90  >90 (mL/min)    GFR calc Af Amer >90  >90 (mL/min)   LIPASE, BLOOD     Status: Normal   Collection Time   12/01/11  3:14 PM      Component Value Range Comment   Lipase 19  11 - 59 (U/L)   CBC     Status: Abnormal   Collection Time   12/01/11  3:14 PM      Component Value Range Comment   WBC 13.4 (*) 4.0 - 10.5 (K/uL)    RBC 4.28  3.87 - 5.11 (MIL/uL)    Hemoglobin 12.8  12.0 - 15.0 (g/dL)    HCT 11.9  14.7 - 82.9 (%)    MCV 89.5  78.0 - 100.0 (fL)    MCH 29.9  26.0 - 34.0 (pg)    MCHC 33.4  30.0 - 36.0 (g/dL)    RDW 56.2  13.0 - 86.5 (%)    Platelets 208  150 - 400 (K/uL)   HEPATIC FUNCTION PANEL     Status: Normal   Collection Time   12/01/11  3:14 PM      Component Value Range Comment   Total Protein 7.4  6.0 - 8.3 (g/dL)    Albumin 4.1  3.5 - 5.2 (g/dL)    AST 20  0 - 37 (U/L)    ALT 15  0 - 35 (U/L)    Alkaline Phosphatase 114  39 - 117 (U/L)    Total Bilirubin 1.0  0.3 - 1.2 (mg/dL)    Bilirubin, Direct 0.2  0.0 - 0.3 (mg/dL)    Indirect Bilirubin 0.8  0.3 - 0.9 (mg/dL)   URINALYSIS, ROUTINE W REFLEX MICROSCOPIC     Status: Abnormal   Collection Time   12/01/11  6:12 PM      Component Value Range Comment   Color, Urine AMBER (*) YELLOW  BIOCHEMICALS MAY BE AFFECTED BY COLOR   APPearance CLOUDY (*) CLEAR     Specific Gravity,  Urine 1.030  1.005 - 1.030     pH 5.0  5.0 - 8.0     Glucose, UA NEGATIVE  NEGATIVE (mg/dL)    Hgb urine dipstick NEGATIVE  NEGATIVE     Bilirubin Urine NEGATIVE  NEGATIVE     Ketones, ur 15 (*) NEGATIVE (mg/dL)    Protein, ur NEGATIVE  NEGATIVE (mg/dL)    Urobilinogen, UA 0.2  0.0 - 1.0 (mg/dL)    Nitrite NEGATIVE  NEGATIVE     Leukocytes, UA NEGATIVE  NEGATIVE  MICROSCOPIC NOT DONE ON URINES WITH NEGATIVE PROTEIN, BLOOD, LEUKOCYTES, NITRITE, OR GLUCOSE <1000 mg/dL.   Ct Abdomen Pelvis W Contrast  12/01/2011  *RADIOLOGY REPORT*  Clinical Data: Abdominal pain.  Nausea.  Diarrhea.  CT ABDOMEN AND PELVIS WITH CONTRAST  Technique:  Multidetector CT imaging of the abdomen and pelvis was performed following the standard protocol during bolus administration of intravenous contrast.  Contrast: OMNIPAQUE IOHEXOL 300 MG/ML  SOLN  Comparison: None.  Findings: The abdominal parenchymal organs are normal appearance. Prior hysterectomy noted.  No masses or lymphadenopathy identified within the abdomen or pelvis.  Enlarged of the appendix is seen with mild periappendiceal inflammatory changes.  This consistent with acute appendicitis. There is no evidence of abscess or free fluid.  No evidence of bowel obstruction or other inflammatory process.  IMPRESSION:  1.  Positive for acute appendicitis. 2.  No evidence of abscess or other complication.  Original Report Authenticated By: Danae Orleans, M.D.    Review of Systems  Constitutional: Positive for chills. Negative for fever.  Gastrointestinal: Positive for nausea, abdominal pain and diarrhea. Negative for vomiting.    Blood pressure 128/83, pulse 78, temperature 98.6 F (37 C), temperature source Oral, resp. rate 16, SpO2 96.00%. Physical Exam  Vitals reviewed. Constitutional: She appears well-developed and well-nourished.  Eyes: No scleral icterus.  Cardiovascular: Normal rate, regular rhythm and normal heart sounds.   Respiratory: Effort normal  and breath sounds normal. She has no wheezes. She has no rales.  GI: Soft. Normal appearance and bowel sounds are normal. There is tenderness in the right lower quadrant. There is tenderness at McBurney's point.     Assessment/Plan Acute appendicitis  We discussed pathophysiology of appendicitis. I recommended surgery with laparoscopic appendectomy.  We discussed perforated vs nonperforated appendicitis and postoperative course.  Risks include but not limited to bleeding, infection, abscess requiring drainage, open procedure, injury to surrounding structures.  No other real option.  Will proceed asap.  Brenya Taulbee 12/02/2011, 2:25 AM

## 2011-12-02 NOTE — Progress Notes (Signed)
Pt arrived to floor in bed from PACU accompanied by RN. Pt recovering from anesthesia, but is in no pain. Full assessment completed except for neuro. IV in right antecubital with NS @ 100. SCD's in place. VSS. Husband at bedside. Husband oriented to room and call bell. Will continue to monitor.

## 2011-12-02 NOTE — Transfer of Care (Signed)
Immediate Anesthesia Transfer of Care Note  Patient: Crystal Green  Procedure(s) Performed: Procedure(s) (LRB): APPENDECTOMY LAPAROSCOPIC (N/A)  Patient Location: PACU  Anesthesia Type: General  Level of Consciousness: awake, alert  and oriented  Airway & Oxygen Therapy: Patient Spontanous Breathing and Patient connected to nasal cannula oxygen  Post-op Assessment: Report given to PACU RN, Post -op Vital signs reviewed and stable and Patient moving all extremities X 4  Post vital signs: Reviewed and stable  Complications: No apparent anesthesia complications

## 2011-12-02 NOTE — ED Notes (Signed)
Pt transported to OR.  RN at bedside.

## 2011-12-02 NOTE — Anesthesia Preprocedure Evaluation (Addendum)
Anesthesia Evaluation  Patient identified by MRN, date of birth, ID band Patient awake    Reviewed: Allergy & Precautions, H&P , NPO status , Patient's Chart, lab work & pertinent test results, reviewed documented beta blocker date and time   History of Anesthesia Complications (+) PONV  Airway Mallampati: I TM Distance: >3 FB Neck ROM: Full    Dental  (+) Teeth Intact   Pulmonary          Cardiovascular hypertension, Pt. on home beta blockers     Neuro/Psych    GI/Hepatic Liver enzymes elevated over past year; pt being monitored by Dr   Tonye Pearson    Renal/GU      Musculoskeletal   Abdominal   Peds  Hematology   Anesthesia Other Findings   Reproductive/Obstetrics                           Anesthesia Physical Anesthesia Plan Anesthesia Quick Evaluation

## 2011-12-03 MED ORDER — OXYCODONE HCL 5 MG PO TABS: 5.0000 mg | ORAL_TABLET | ORAL | Status: AC | PRN

## 2011-12-03 MED ORDER — OXYCODONE-ACETAMINOPHEN 10-325 MG PO TABS: 1.0000 | ORAL_TABLET | Freq: Four times a day (QID) | ORAL | Status: DC | PRN

## 2011-12-03 NOTE — Discharge Summary (Signed)
Physician Discharge Summary  [name] MRN: [id number] DOB/AGE: 26-Aug-1960 51 y.o.  Admit date: 12/01/2011 Discharge date: 12/03/2011  Admission Diagnoses: Acute appendicits Discharge Diagnoses:  Principal Problem:  *Acute appendicitis   Discharged Condition: good  Hospital Course: 51 yof admitted with acute appendicitis and underwent lap appendectomy for acute suppurative appendicitis.  She has return of bowel function, tolerating regular diet, ambulating and pain is controlled.  Consults: None  Significant Diagnostic Studies: none  Treatments: surgery: lap appy    Disposition:    Medication List  As of 12/03/2011  8:28 AM   TAKE these medications         CLARITIN PO   Take 1 tablet by mouth daily as needed. For allergies  childrens claritin      metoprolol succinate 25 MG 24 hr tablet   Commonly known as: TOPROL-XL   Take 37.5 mg by mouth daily.      oxyCODONE-acetaminophen 10-325 MG per tablet   Commonly known as: PERCOCET   Take 1-2 tablets by mouth every 6 (six) hours as needed for pain.           Follow-up Information    Follow up with [facility], [place]. Schedule an appointment as soon as possible for a visit in 3 weeks.   Contact information:   [employer] [address] [place] [address] [phone]          Signed: Emelia Loron 12/03/2011, 8:28 AM

## 2011-12-03 NOTE — Progress Notes (Signed)
1 Day Post-Op  Subjective: Feels better, passing flatus, tol diet, ambulating  Objective: Vital signs in last 24 hours: Temp:  [97.3 F (36.3 C)-98.7 F (37.1 C)] 97.3 F (36.3 C) (04/14 0619) Pulse Rate:  [57-82] 57  (04/14 0619) Resp:  [16-19] 19  (04/14 0619) BP: (116-132)/(58-68) 124/64 mmHg (04/14 0619) SpO2:  [96 %-100 %] 98 % (04/14 0619) Last BM Date: 12/02/11  Intake/Output from previous day: 04/13 0701 - 04/14 0700 In: 2000 [P.O.:1200; I.V.:800] Out: 752 [Urine:752] Intake/Output this shift:    General appearance: no distress GI: bs present, wounds clean without infection  Lab Results:   Basename 12/01/11 1514  WBC 13.4*  HGB 12.8  HCT 38.3  PLT 208   BMET  Basename 12/01/11 1514  NA 140  K 3.9  CL 104  CO2 23  GLUCOSE 122*  BUN 15  CREATININE 0.79  CALCIUM 9.2   PT/INR No results found for this basename: LABPROT:2,INR:2 in the last 72 hours ABG No results found for this basename: PHART:2,PCO2:2,PO2:2,HCO3:2 in the last 72 hours  Studies/Results: Ct Abdomen Pelvis W Contrast  12/01/2011  *RADIOLOGY REPORT*  Clinical Data: Abdominal pain.  Nausea.  Diarrhea.  CT ABDOMEN AND PELVIS WITH CONTRAST  Technique:  Multidetector CT imaging of the abdomen and pelvis was performed following the standard protocol during bolus administration of intravenous contrast.  Contrast: OMNIPAQUE IOHEXOL 300 MG/ML  SOLN  Comparison: None.  Findings: The abdominal parenchymal organs are normal appearance. Prior hysterectomy noted.  No masses or lymphadenopathy identified within the abdomen or pelvis.  Enlarged of the appendix is seen with mild periappendiceal inflammatory changes.  This consistent with acute appendicitis. There is no evidence of abscess or free fluid.  No evidence of bowel obstruction or other inflammatory process.  IMPRESSION:  1.  Positive for acute appendicitis. 2.  No evidence of abscess or other complication.  Original Report Authenticated By: Danae Orleans, M.D.    Anti-infectives: Anti-infectives     Start     Dose/Rate Route Frequency Ordered Stop   12/01/11 2345   ertapenem (INVANZ) 1 g in sodium chloride 0.9 % 50 mL IVPB        1 g 100 mL/hr over 30 Minutes Intravenous  Once 12/01/11 2341 12/02/11 0127          Assessment/Plan: POD #1 lap appy Dc home today   LOS: 2 days    [facility] 12/03/2011

## 2011-12-04 ENCOUNTER — Encounter (HOSPITAL_COMMUNITY): Payer: Self-pay | Admitting: General Surgery

## 2011-12-04 ENCOUNTER — Telehealth (INDEPENDENT_AMBULATORY_CARE_PROVIDER_SITE_OTHER): Payer: Self-pay | Admitting: General Surgery

## 2011-12-04 NOTE — Progress Notes (Signed)
Ur of chart complete.  

## 2011-12-15 ENCOUNTER — Ambulatory Visit (INDEPENDENT_AMBULATORY_CARE_PROVIDER_SITE_OTHER): Payer: BC Managed Care – PPO | Admitting: General Surgery

## 2011-12-15 ENCOUNTER — Encounter (INDEPENDENT_AMBULATORY_CARE_PROVIDER_SITE_OTHER): Payer: Self-pay | Admitting: General Surgery

## 2011-12-15 VITALS — BP 130/88 | HR 72 | Temp 97.6°F | Resp 18 | Ht 66.75 in | Wt 219.2 lb

## 2011-12-15 DIAGNOSIS — Z09 Encounter for follow-up examination after completed treatment for conditions other than malignant neoplasm: Secondary | ICD-10-CM

## 2011-12-15 NOTE — Progress Notes (Signed)
Subjective:     [name], female   DOB: 1960-09-11, 51 y.o.   MRN: [id number]  HPI This is a 51 year old female who presents after undergoing a laparoscopic appendectomy for acute appendicitis at the hospital. She's doing well postoperatively and really does not have any significant complaints. She is eating well and having normal bowel movements. She denies any fevers.  Review of Systems     Objective:   Physical Exam Healing incisions without infection    Assessment:     S/p lap appy     Plan:     Return to full activity Return to see me as needed She needs colonoscopy at age 73

## 2014-01-01 ENCOUNTER — Other Ambulatory Visit: Payer: Self-pay | Admitting: Gastroenterology

## 2014-09-30 ENCOUNTER — Other Ambulatory Visit: Payer: Self-pay

## 2014-09-30 DIAGNOSIS — Z1231 Encounter for screening mammogram for malignant neoplasm of breast: Secondary | ICD-10-CM

## 2014-10-08 ENCOUNTER — Ambulatory Visit
Admission: RE | Admit: 2014-10-08 | Discharge: 2014-10-08 | Disposition: A | Discharge status: Home / Self Care | Payer: BLUE CROSS/BLUE SHIELD | Source: Ambulatory Visit

## 2014-10-08 DIAGNOSIS — Z1231 Encounter for screening mammogram for malignant neoplasm of breast: Secondary | ICD-10-CM

## 2014-11-12 ENCOUNTER — Other Ambulatory Visit: Payer: Self-pay | Admitting: Dermatology

## 2015-04-13 ENCOUNTER — Other Ambulatory Visit: Payer: Self-pay | Admitting: Gastroenterology

## 2016-10-25 DIAGNOSIS — J329 Chronic sinusitis, unspecified: Secondary | ICD-10-CM | POA: Diagnosis not present

## 2016-11-21 DIAGNOSIS — I10 Essential (primary) hypertension: Secondary | ICD-10-CM | POA: Diagnosis not present

## 2016-11-21 DIAGNOSIS — R7303 Prediabetes: Secondary | ICD-10-CM | POA: Diagnosis not present

## 2016-11-21 DIAGNOSIS — Z0000 Encounter for general adult medical examination without abnormal findings: Secondary | ICD-10-CM | POA: Diagnosis not present

## 2016-11-27 ENCOUNTER — Other Ambulatory Visit: Payer: Self-pay | Admitting: Internal Medicine

## 2016-11-27 DIAGNOSIS — Z1231 Encounter for screening mammogram for malignant neoplasm of breast: Secondary | ICD-10-CM

## 2016-11-28 DIAGNOSIS — Z0000 Encounter for general adult medical examination without abnormal findings: Secondary | ICD-10-CM | POA: Diagnosis not present

## 2016-11-28 DIAGNOSIS — R7303 Prediabetes: Secondary | ICD-10-CM | POA: Diagnosis not present

## 2016-11-28 DIAGNOSIS — N183 Chronic kidney disease, stage 3 (moderate): Secondary | ICD-10-CM | POA: Diagnosis not present

## 2016-11-30 ENCOUNTER — Ambulatory Visit
Admission: RE | Admit: 2016-11-30 | Discharge: 2016-11-30 | Disposition: A | Discharge status: Home / Self Care | Payer: Commercial Managed Care - HMO | Source: Ambulatory Visit | Attending: Internal Medicine | Admitting: Internal Medicine

## 2016-11-30 DIAGNOSIS — Z1231 Encounter for screening mammogram for malignant neoplasm of breast: Secondary | ICD-10-CM

## 2017-03-08 DIAGNOSIS — M7672 Peroneal tendinitis, left leg: Secondary | ICD-10-CM | POA: Diagnosis not present

## 2017-03-08 DIAGNOSIS — M25572 Pain in left ankle and joints of left foot: Secondary | ICD-10-CM | POA: Diagnosis not present

## 2017-03-26 DIAGNOSIS — M7672 Peroneal tendinitis, left leg: Secondary | ICD-10-CM | POA: Diagnosis not present

## 2017-03-28 DIAGNOSIS — M7672 Peroneal tendinitis, left leg: Secondary | ICD-10-CM | POA: Diagnosis not present

## 2017-03-30 DIAGNOSIS — M7672 Peroneal tendinitis, left leg: Secondary | ICD-10-CM | POA: Diagnosis not present

## 2017-04-02 DIAGNOSIS — M7672 Peroneal tendinitis, left leg: Secondary | ICD-10-CM | POA: Diagnosis not present

## 2017-04-04 DIAGNOSIS — M7672 Peroneal tendinitis, left leg: Secondary | ICD-10-CM | POA: Diagnosis not present

## 2017-05-16 DIAGNOSIS — M7672 Peroneal tendinitis, left leg: Secondary | ICD-10-CM | POA: Diagnosis not present

## 2017-06-05 DIAGNOSIS — M7672 Peroneal tendinitis, left leg: Secondary | ICD-10-CM | POA: Diagnosis not present

## 2017-06-12 DIAGNOSIS — E039 Hypothyroidism, unspecified: Secondary | ICD-10-CM | POA: Diagnosis not present

## 2017-06-12 DIAGNOSIS — I10 Essential (primary) hypertension: Secondary | ICD-10-CM | POA: Diagnosis not present

## 2017-06-12 DIAGNOSIS — R7303 Prediabetes: Secondary | ICD-10-CM | POA: Diagnosis not present

## 2017-06-13 DIAGNOSIS — M7672 Peroneal tendinitis, left leg: Secondary | ICD-10-CM | POA: Diagnosis not present

## 2017-06-19 DIAGNOSIS — I10 Essential (primary) hypertension: Secondary | ICD-10-CM | POA: Diagnosis not present

## 2017-06-19 DIAGNOSIS — R7303 Prediabetes: Secondary | ICD-10-CM | POA: Diagnosis not present

## 2017-06-19 DIAGNOSIS — K7581 Nonalcoholic steatohepatitis (NASH): Secondary | ICD-10-CM | POA: Diagnosis not present

## 2017-10-02 DIAGNOSIS — E039 Hypothyroidism, unspecified: Secondary | ICD-10-CM | POA: Diagnosis not present

## 2017-10-02 DIAGNOSIS — N183 Chronic kidney disease, stage 3 (moderate): Secondary | ICD-10-CM | POA: Diagnosis not present

## 2017-10-02 DIAGNOSIS — I10 Essential (primary) hypertension: Secondary | ICD-10-CM | POA: Diagnosis not present

## 2017-10-02 DIAGNOSIS — R7303 Prediabetes: Secondary | ICD-10-CM | POA: Diagnosis not present

## 2017-10-19 DIAGNOSIS — R748 Abnormal levels of other serum enzymes: Secondary | ICD-10-CM | POA: Diagnosis not present

## 2017-10-19 DIAGNOSIS — K7581 Nonalcoholic steatohepatitis (NASH): Secondary | ICD-10-CM | POA: Diagnosis not present

## 2017-10-19 DIAGNOSIS — R55 Syncope and collapse: Secondary | ICD-10-CM | POA: Diagnosis not present

## 2017-11-01 DIAGNOSIS — K7581 Nonalcoholic steatohepatitis (NASH): Secondary | ICD-10-CM | POA: Diagnosis not present

## 2017-11-01 DIAGNOSIS — R55 Syncope and collapse: Secondary | ICD-10-CM | POA: Diagnosis not present

## 2017-11-01 DIAGNOSIS — K760 Fatty (change of) liver, not elsewhere classified: Secondary | ICD-10-CM | POA: Diagnosis not present

## 2017-11-01 DIAGNOSIS — R748 Abnormal levels of other serum enzymes: Secondary | ICD-10-CM | POA: Diagnosis not present

## 2017-11-01 DIAGNOSIS — I361 Nonrheumatic tricuspid (valve) insufficiency: Secondary | ICD-10-CM | POA: Diagnosis not present

## 2017-11-16 DIAGNOSIS — N183 Chronic kidney disease, stage 3 (moderate): Secondary | ICD-10-CM | POA: Diagnosis not present

## 2017-11-16 DIAGNOSIS — I10 Essential (primary) hypertension: Secondary | ICD-10-CM | POA: Diagnosis not present

## 2017-11-16 DIAGNOSIS — R55 Syncope and collapse: Secondary | ICD-10-CM | POA: Diagnosis not present

## 2017-11-26 DIAGNOSIS — R55 Syncope and collapse: Secondary | ICD-10-CM | POA: Diagnosis not present

## 2017-11-26 DIAGNOSIS — I10 Essential (primary) hypertension: Secondary | ICD-10-CM | POA: Diagnosis not present

## 2017-11-26 DIAGNOSIS — N183 Chronic kidney disease, stage 3 (moderate): Secondary | ICD-10-CM | POA: Diagnosis not present

## 2018-01-17 ENCOUNTER — Encounter: Payer: Self-pay | Admitting: Neurology

## 2018-01-17 DIAGNOSIS — I10 Essential (primary) hypertension: Secondary | ICD-10-CM | POA: Diagnosis not present

## 2018-01-17 DIAGNOSIS — Z0189 Encounter for other specified special examinations: Secondary | ICD-10-CM | POA: Diagnosis not present

## 2018-01-17 DIAGNOSIS — R55 Syncope and collapse: Secondary | ICD-10-CM | POA: Diagnosis not present

## 2018-01-21 ENCOUNTER — Ambulatory Visit: Payer: 59 | Admitting: Neurology

## 2018-01-21 ENCOUNTER — Encounter: Payer: Self-pay | Admitting: Neurology

## 2018-01-21 VITALS — BP 147/86 | HR 61 | Ht 66.0 in | Wt 236.0 lb

## 2018-01-21 DIAGNOSIS — Z79899 Other long term (current) drug therapy: Secondary | ICD-10-CM | POA: Diagnosis not present

## 2018-01-21 DIAGNOSIS — R002 Palpitations: Secondary | ICD-10-CM

## 2018-01-21 DIAGNOSIS — R55 Syncope and collapse: Secondary | ICD-10-CM | POA: Diagnosis not present

## 2018-01-21 DIAGNOSIS — H269 Unspecified cataract: Secondary | ICD-10-CM | POA: Insufficient documentation

## 2018-01-21 DIAGNOSIS — R0683 Snoring: Secondary | ICD-10-CM | POA: Diagnosis not present

## 2018-01-21 NOTE — Progress Notes (Signed)
SLEEP MEDICINE CLINIC   Provider:  Melvyn Novas, M.D.  Primary Care Physician:  Georgianne Fick, MD   Referring Provider: Georgianne Fick, MD   Chief Complaint  Patient presents with  . New Patient (Initial Visit)    pt alone, rm 10. pt here today to assess neurocardiogenic syncope and she also informed him that she snores in sleep, and she also expressed daytime sleepiness. pt denies passing out, but she feels like she is going to and will become sweaty and she feels like she has to rush to bathroom cause she feels she may have a bowels. she states this is intermittently and not at a particular time.     HPI:  Crystal Green is a 57 y.o. female patient, seen here as in a referral from Dr. Jacinto Halim / Dr. Nicholos Johns for evaluation of a syncope- and possible relation to sleep apnea/ snoring.   In 2013 she was evaluated for cardiovascular syncope, by Dr. Sandria Manly and Dr Jacinto Halim, and no cause was found. She has had a tile table test at the time- negative.   New syncopal episodes have been present for many years and over the last 2 to 3 years more frequently.  Now she has 1 maybe every 8 weeks.  She has noticed episodes while visiting in [place] during the warm summer months.  Dizziness has been associated with loss of bowel control and vomiting.  No chest pain, shortness of breath - but chest tightness and cardiac palpitations were noted.  She will suddenly feel a lightheadedness sensation come over her and urgently has to seek out the next restroom.  At some time she has not lost consciousness with these events-  just near syncope.  She had tried propranolol which caused fatigue and on atenolol has noticed a mild improvement in her symptoms.  She has also had episodes now while at home and sometimes in places like a department store which should be air-conditioned and not hot and humid at all.  Her hands gets sweaty and she feels flushed and has explosive diarrhea- she had negative  colonoscopies and neither irritable bowel nor carcinoid were found. .  She has also not exercised as she is scared to induce a fainting spell.  She has experienced this as socially isolating -  She is not a previous tobacco user, has never been known to have any addiction problem, with either pain medication or alcohol.    Chief complaint according to patient : "near syncope with associated symptoms" . "I do snore "  Sleep habits are as follows: Crystal Green reports that she usually watches TV for the hours before she will retreat to bed.  Bedtime will be around 10-10.30 PM, but struggles to enter sleep.  She may be awake for 30, even 60 minutes.  She will sleep on her side, with one firm pillow for head support.  The bedroom is cool and dark,and quiet. She does not watch TV in the bedroom.  When she struggles to go to sleep she usually struggles with some thoughts, some agitation, some stress. No restless legs reported. Once asleep she can stay asleep, but her husband wakes her up for loud snoring. No nocturia. She sleeps better when he travels. She dreams, not a lot.  She rises at 7.30 - 8 AM, spontaneously, no alarm is needed. Most nights she will have 7-8 hours of sleep.  Sleep medical history and family sleep history:  2 brothers , unsure of either has OSA. Father  is 88 and always asleep.  No history of :Sleep walking, night terrors, neither enuresis. tonsillectomy as an adult , age 57, complicated by a hemorrhage.   Social history:  Married, 3  Adult children ages 33170- 28, [place] daughter lives in [place] with a one year old granddaughter.   Non smoker, seldomly consuming alcohol,  Caffeine use;  2-3 glasses a week of unsweet tea , diet soda, not coffee.  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  Review of Systems: Out of a complete 14 system review, the patient complains of only the following symptoms, and all other reviewed systems are negative.  HTN-  Rare headaches. Snoring, neck pain, occipital  pain.   Epworth score: 3/ 24   , Fatigue severity score 15, depression score: N/a    Social History   Socioeconomic History  . Marital status: Married    Spouse name: Not on file  . Number of children: Not on file  . Years of education: Not on file  . Highest education level: Not on file  Occupational History  . Not on file  Social Needs  . Financial resource strain: Not on file  . Food insecurity:    Worry: Not on file    Inability: Not on file  . Transportation needs:    Medical: Not on file    Non-medical: Not on file  Tobacco Use  . Smoking status: Never Smoker  . Smokeless tobacco: Never Used  Substance and Sexual Activity  . Alcohol use: Yes    Comment: rarely  . Drug use: Not Currently  . Sexual activity: Not on file  Lifestyle  . Physical activity:    Days per week: Not on file    Minutes per session: Not on file  . Stress: Not on file  Relationships  . Social connections:    Talks on phone: Not on file    Gets together: Not on file    Attends religious service: Not on file    Active member of club or organization: Not on file    Attends meetings of clubs or organizations: Not on file    Relationship status: Not on file  . Intimate partner violence:    Fear of current or ex partner: Not on file    Emotionally abused: Not on file    Physically abused: Not on file    Forced sexual activity: Not on file  Other Topics Concern  . Not on file  Social History Narrative  . Not on file    Family History  Problem Relation Age of Onset  . Hypertension Mother     Past Medical History:  Diagnosis Date  . Chronic kidney disease    stage 3  . Hypertension     Past Surgical History:  Procedure Laterality Date  . ABDOMINAL HYSTERECTOMY    . CATARACT EXTRACTION, BILATERAL  2003  . KNEE SURGERY Right 2012   arthroscopic  . LAPAROSCOPIC APPENDECTOMY  12/02/2011   Procedure: APPENDECTOMY LAPAROSCOPIC;  Surgeon: Emelia LoronMatthew Wakefield, MD;  [facility];   Service: General;  Laterality: N/A;  . TONSILLECTOMY      Current Outpatient Medications  Medication Sig Dispense Refill  . atenolol (TENORMIN) 50 MG tablet Take 50 mg by mouth 2 (two) times daily.  0  . Loratadine (CLARITIN PO) Take 1 tablet by mouth daily as needed. For allergies childrens claritin     No current facility-administered medications for this visit.     Allergies as of 01/21/2018 -  Review Complete 01/21/2018  Allergen Reaction Noted  . Codeine Other (See Comments) 12/01/2011  . Sudafed [pseudoephedrine] Other (See Comments) 12/01/2011  . Amoxicillin Rash 12/01/2011    Vitals: BP (!) 147/86   Pulse 61   Ht 5\' 6"  (1.676 m)   Wt 236 lb (107 kg)   BMI 38.09 kg/m  Last Weight:  Wt Readings from Last 1 Encounters:  01/21/18 236 lb (107 kg)   ZOX:WRUE mass index is 38.09 kg/m.     Last Height:   Ht Readings from Last 1 Encounters:  01/21/18 5\' 6"  (1.676 m)    Physical exam:  General: The patient is awake, alert and appears not in acute distress. The patient is well groomed. Head: Normocephalic, atraumatic. Neck is supple. Mallampati 3 , wears braces.  neck circumference: 16"  . Nasal airflow congested - runny nose. coughing ,  TMJ click not evident . Retrognathia is not seen.  Cardiovascular:  Regular rate and rhythm , without  murmurs or carotid bruit, and without distended neck veins. Respiratory: Lungs are clear to auscultation. Skin:  Without evidence of edema, or rash Trunk: BMI is 38 - "each pregnancy I gained and kept " The patient's posture is erect.   Neurologic exam : The patient is awake and alert, oriented to place and time.    Attention span & concentration ability appears normal.  Speech is fluent,  without dysarthria, dysphonia or aphasia.  Mood and affect are appropriate.  Cranial nerves:Pupils are equal and briskly reactive to light. Funduscopic exam status post cataract . Extraocular movements  in vertical and horizontal planes intact and  without nystagmus. Visual fields by finger perimetry are intact. Hearing to finger rub intact. Facial sensation intact to fine touch.Facial motor strength is symmetric and tongue and uvula move midline. Shoulder shrug was symmetrical.  Motor exam:  Normal tone, muscle bulk and symmetric strength in all extremities. Sensory:  Fine touch, pinprick and vibration were tested in all extremities. Proprioception tested in the upper extremities was normal. Coordination: Rapid alternating movements in the fingers/hands were normal. Finger-to-nose maneuver  normal without evidence of ataxia, dysmetria or tremor. Gait and station: Patient walks without assistive device .Tandem gait is unfragmented. Turns with 3 Steps. Romberg testing is negative. Deep tendon reflexes: in the  upper and lower extremities are symmetric and intact. Status post meniscectomy on the right. Babinski maneuver response is downgoing.  Assessment:  After physical and neurologic examination, review of laboratory studies,  Personal review of imaging studies, reports of other /same  Imaging studies, results of polysomnography and / or neurophysiology testing and pre-existing records as far as provided in visit., my assessment is   1)  Obesity, large neck size and reported snoring- check for OSA.   2)  Neither excessive daytime sleepiness nor fatigue were reported. She reports asthma- allergic to perfumes and scents.   3) syncope - not sure what leads to it but an attended  sleep study is indicated to evaluate heart rate, REM dependent apnea, etc.    The patient was advised of the nature of the diagnosed disorder , the treatment options and the  risks for general health and wellness arising from not treating the condition.   I spent more than 35  minutes of face to face time with the patient.  Greater than 50% of time was spent in counseling and coordination of care. We have discussed the diagnosis and differential and I answered the  patient's questions.    Plan:  Treatment plan and additional workup :  She would need an attended sleep study - [employer] may not permit this.  I need to look at snoring, sleep position, and REM dependent heart rate changes.  Very dry mouth in AM-    Melvyn Novas, MD 01/21/2018, 10:12 AM  Certified in Neurology by ABPN Certified in Sleep Medicine by [facility] [address] [place], [place] [address]

## 2018-02-06 ENCOUNTER — Telehealth: Payer: Self-pay

## 2018-02-06 NOTE — Telephone Encounter (Signed)
We have attempted to call the patient two times to schedule sleep study.  Patient has been unavailable at the phone numbers we have on file and has not returned our calls.  At this point we will send a letter asking patient to please contact the sleep lab to schedule their sleep study.  If patient calls back we will schedule them for their sleep study. 

## 2018-03-13 ENCOUNTER — Ambulatory Visit: Payer: 59 | Admitting: Neurology

## 2018-03-13 DIAGNOSIS — G4733 Obstructive sleep apnea (adult) (pediatric): Secondary | ICD-10-CM

## 2018-03-13 DIAGNOSIS — R55 Syncope and collapse: Secondary | ICD-10-CM

## 2018-03-13 DIAGNOSIS — H269 Unspecified cataract: Secondary | ICD-10-CM

## 2018-03-13 DIAGNOSIS — Z79899 Other long term (current) drug therapy: Secondary | ICD-10-CM

## 2018-03-13 DIAGNOSIS — R0683 Snoring: Secondary | ICD-10-CM

## 2018-03-19 NOTE — Procedures (Signed)
[facility] [address] [place], [place] [id number] NAME:  Crystal KielConnie L. Hass                                                                DOB: 04-Nov-1960 MEDICAL RECORD NUMBER  [id number]                                                DOS:  03/13/2018 REFERRING PHYSICIAN: Georgianne FickAjith Ramachandran, M.D. STUDY PERFORMED: Home Sleep Study HISTORY: Crystal KielConnie L Green is a 57 y.o. female patient, seen here as in a referral from Dr. Jacinto HalimGanji / Dr. Nicholos Johnsamachandran for evaluation of a syncope- and possible relation to sleep apnea/ snoring.  In 2013 she was evaluated for cardiovascular syncope, at the time by Dr. Sandria ManlyLove and Dr. Jacinto HalimGanji, and no cause was found. She has had a tilt table test at the time, results were negative.   New syncopal episodes have been present for many years and over the last 2 to 3 years more frequently.  Now she has 1 maybe every 8 weeks.  She has noticed episodes [place] months.  Dizziness has been associated with loss of bowel control and vomiting.  No chest pain, shortness of breath - but chest tightness and cardiac palpitations were noted.  She will suddenly feel a lightheadedness sensation come over her and urgently has to seek out the next restroom. She had not lost consciousness with these events, a near- syncope.   She has also had episodes in air-conditioned places and not hot and humid at all.  Her hands gets sweaty and she feels flushed and has explosive diarrhea- she had negative colonoscopies and neither irritable bowel nor carcinoid were found. Epworth score: 3/ 24, Fatigue severity score 15, BMI: 37.9  STUDY RESULTS:  Total Recording Time: 8 hours 24 minutes, valid test time hours and 48 minutes. Total Apnea/Hypopnea Index (AHI): 31.3 /h; RDI:  32.1 /h Average Oxygen Saturation: 94 %; Lowest Oxygen Desaturation: 83 %  Total Time Oxygen Saturation Below or at 88 %: 1.4 minutes  Average Heart Rate: 60 bpm (variable sinus  rhythm between 44 and 106 bpm).  IMPRESSION: Moderate- Severe OSA without Central Apneas. No clinically significant hypoxemia. Normal heart rate variability, sinus bradycardia was intermittent and can be related to Beta Blocker use.  RECOMMENDATION: CPAP therapy is recommended. Alternatives such as dental device for apnea reduction and snoring can be considered. The patient should lose weight. I will order an auto-titration 5-18 cm water pressure, heated humidity and interface of patient's choice and comfort. I certify that I have reviewed the raw data recording prior to the issuance of this report in accordance with the standards of the American Academy of Sleep Medicine (AASM). Melvyn Novasarmen Aliscia Clayton, M.D.  03-19-2018    Medical Director of [facility], accredited by the AASM. Diplomat of the ABPN and ABSM.

## 2018-03-19 NOTE — Addendum Note (Signed)
Addended by: Melvyn NovasHMEIER, Brynley Cuddeback on: 03/19/2018 05:40 PM   Modules accepted: Orders

## 2018-03-20 ENCOUNTER — Telehealth: Payer: Self-pay | Admitting: Neurology

## 2018-03-20 NOTE — Telephone Encounter (Signed)
-----   Message from Melvyn Novasarmen Dohmeier, MD sent at 03/19/2018  5:39 PM EDT ----- IMPRESSION: Moderate- Severe OSA without Central Apneas. No clinically significant hypoxemia. Normal heart rate variability, sinus bradycardia was intermittent and can be related to Beta Blocker use.  RECOMMENDATION: CPAP therapy is recommended. Alternatives such as dental device for apnea reduction and snoring can be considered. The patient should lose weight. I will order an auto-titration 5-18 cm water pressure, heated humidity and interface of patient's choice and comfort.

## 2018-03-20 NOTE — Telephone Encounter (Signed)
Called patient to discuss sleep study results. No answer at this time. LVM for the patient to call back.   

## 2018-03-20 NOTE — Telephone Encounter (Signed)
I called pt. I advised pt that Dr. Vickey Hugerohmeier reviewed their sleep study results and found that pt has sleep apnea. Dr. Doristine Locksohemeir recommends that pt starts an auto CPAP machine. Other alternatives would include weight loss and dental device. Pt is currently wearing braces and unsure if that is an option for her. I went over CPAP and how it works. I reviewed also what a dental device does. Pt verbalized understanding of results. Pt would like to discuss with her husband and will contact us back with which route she would prefer to explore. Pt had no questions at this time but was encouraged to call back if questions arise.

## 2018-04-01 DIAGNOSIS — G4733 Obstructive sleep apnea (adult) (pediatric): Secondary | ICD-10-CM | POA: Diagnosis not present

## 2018-04-01 DIAGNOSIS — J22 Unspecified acute lower respiratory infection: Secondary | ICD-10-CM | POA: Diagnosis not present

## 2018-04-01 DIAGNOSIS — R197 Diarrhea, unspecified: Secondary | ICD-10-CM | POA: Diagnosis not present

## 2018-04-03 ENCOUNTER — Telehealth: Payer: Self-pay

## 2018-04-03 ENCOUNTER — Encounter: Payer: Self-pay | Admitting: Neurology

## 2018-04-03 NOTE — Telephone Encounter (Signed)
I called the patient and informed her that I can send the order for her to [facility]. I reviewed PAP compliance expectations with the pt. Pt is agreeable to starting a CPAP. I advised pt that an order will be sent to a DME, [facility], and [facility] will call the pt within about one week after they file with the pt's insurance. [facility] will show the pt how to use the machine, fit for masks, and troubleshoot the CPAP if needed. A follow up appt was made for insurance purposes with Dr. Vickey Hugerohmeier on Nov 20,2019 at 8:30 am. Pt verbalized understanding to arrive 15 minutes early and bring their CPAP. A letter with all of this information in it will be mailed to the pt as a reminder. I verified with the pt that the address we have on file is correct. Pt verbalized understanding of results. Pt had no questions at this time but was encouraged to call back if questions arise.

## 2018-04-03 NOTE — Telephone Encounter (Signed)
Pt has decided that she is ready to start CPAP.  Informed pt I would let Dr. Oliva Bustardohmeier's nurse now.  And that she will be contacted soon to be informed of details of the next steps for her.

## 2018-04-09 ENCOUNTER — Telehealth: Payer: Self-pay | Admitting: Neurology

## 2018-04-09 NOTE — Telephone Encounter (Signed)
[facility] is not in network. She said [facility] and [facility] are DME in network. Pt said [facility] is closer to her home than [facility]. Please call to advise

## 2018-04-10 DIAGNOSIS — R0981 Nasal congestion: Secondary | ICD-10-CM | POA: Diagnosis not present

## 2018-04-10 NOTE — Telephone Encounter (Signed)
Patient insurance company is reaching out and informing me that [facility] is not in her insurance plan network. I have forwarded the orders over to [facility] for the patient to get set up through them. Pt should hear from [facility] within the week to get set up.  Again [facility] number is [phone] if patient calls in please give her the number so that she can follow up.

## 2018-04-10 NOTE — Telephone Encounter (Signed)
Called the patient. I touched base with [facility] to verify because it was my understanding that they were in network. They did confirm they are in network with the patient. They explained the financial to the patient and patient states that her insurance has stated [facility] is not in network. Informed her that based off what I had asked [facility] was if you went to a different company [facility] for example would the cost and everything be the same and they said yes because its all dictated by insurance. I have informed the patient of this information and she states that she will get back with me and advise how she would like to move forward.   If patient calls back please just advise if she wants me to transfer to [facility] I will send it over and give her the number to [facility] to follow up with them [phone]

## 2018-04-18 DIAGNOSIS — G4733 Obstructive sleep apnea (adult) (pediatric): Secondary | ICD-10-CM | POA: Diagnosis not present

## 2018-05-01 DIAGNOSIS — K7581 Nonalcoholic steatohepatitis (NASH): Secondary | ICD-10-CM | POA: Diagnosis not present

## 2018-05-01 DIAGNOSIS — K580 Irritable bowel syndrome with diarrhea: Secondary | ICD-10-CM | POA: Diagnosis not present

## 2018-05-19 DIAGNOSIS — G4733 Obstructive sleep apnea (adult) (pediatric): Secondary | ICD-10-CM | POA: Diagnosis not present

## 2018-05-29 DIAGNOSIS — K7581 Nonalcoholic steatohepatitis (NASH): Secondary | ICD-10-CM | POA: Diagnosis not present

## 2018-05-29 DIAGNOSIS — K580 Irritable bowel syndrome with diarrhea: Secondary | ICD-10-CM | POA: Diagnosis not present

## 2018-05-31 DIAGNOSIS — Z23 Encounter for immunization: Secondary | ICD-10-CM | POA: Diagnosis not present

## 2018-06-04 DIAGNOSIS — E039 Hypothyroidism, unspecified: Secondary | ICD-10-CM | POA: Diagnosis not present

## 2018-06-04 DIAGNOSIS — N183 Chronic kidney disease, stage 3 (moderate): Secondary | ICD-10-CM | POA: Diagnosis not present

## 2018-06-11 DIAGNOSIS — Z0000 Encounter for general adult medical examination without abnormal findings: Secondary | ICD-10-CM | POA: Diagnosis not present

## 2018-06-11 DIAGNOSIS — N183 Chronic kidney disease, stage 3 (moderate): Secondary | ICD-10-CM | POA: Diagnosis not present

## 2018-06-11 DIAGNOSIS — R05 Cough: Secondary | ICD-10-CM | POA: Diagnosis not present

## 2018-06-11 DIAGNOSIS — G4733 Obstructive sleep apnea (adult) (pediatric): Secondary | ICD-10-CM | POA: Diagnosis not present

## 2018-06-18 DIAGNOSIS — G4733 Obstructive sleep apnea (adult) (pediatric): Secondary | ICD-10-CM | POA: Diagnosis not present

## 2018-07-09 ENCOUNTER — Telehealth: Payer: Self-pay | Admitting: Neurology

## 2018-07-09 NOTE — Telephone Encounter (Signed)
Called the patient to advise her to bring CPAP machine with her to the apt tomorrow. No answer. LVM informing the pt to do this as I am unable to see her tagged online. LVM informing her of this.

## 2018-07-10 ENCOUNTER — Ambulatory Visit: Payer: 59 | Admitting: Neurology

## 2018-07-10 ENCOUNTER — Encounter: Payer: Self-pay | Admitting: Neurology

## 2018-07-10 VITALS — BP 147/84 | HR 59 | Ht 66.0 in | Wt 241.0 lb

## 2018-07-10 DIAGNOSIS — G4733 Obstructive sleep apnea (adult) (pediatric): Secondary | ICD-10-CM | POA: Diagnosis not present

## 2018-07-10 DIAGNOSIS — I10 Essential (primary) hypertension: Secondary | ICD-10-CM | POA: Diagnosis not present

## 2018-07-10 DIAGNOSIS — Z9989 Dependence on other enabling machines and devices: Secondary | ICD-10-CM | POA: Diagnosis not present

## 2018-07-10 DIAGNOSIS — M25471 Effusion, right ankle: Secondary | ICD-10-CM | POA: Diagnosis not present

## 2018-07-10 DIAGNOSIS — M25472 Effusion, left ankle: Secondary | ICD-10-CM

## 2018-07-10 NOTE — Progress Notes (Signed)
SLEEP MEDICINE CLINIC   Provider:  Melvyn Novas, M.D.   Primary Care Physician:  Crystal Fick, MD   Referring Provider: Georgianne Fick, MD   Chief Complaint  Patient presents with  . Follow-up    PT. alone, rm 10. Had  HST and is on auto CPAP now.  pt states that the machine has been ok. DME- [facility]. Pt. has a lot of health concerns she would like to discuss.    I have the pleasure of meeting today on 10 July 2018 with Crystal Green, a 57 year old Caucasian female patient of Dr. Carolyn Green, following up after a home sleep test.  The patient had a home sleep study on 13 March 2018 with a total recording time of 8 hours and 24 minutes and a valid test time of 6 hours and 48 minutes, study revealed an AHI of 31.3, an average oxygen saturation of 94% with a nadir of 83.  The patient only spent 1.4 minutes of the total sleep time in desaturation but her heart rate varied greatly between 44 and 106 bpm also it was sinus rhythm.  CPAP therapy was recommended and the patient started in autotitrator between 5 and 18 cmH2O pressure pre-CPAP Epworth sleepiness score was endorsed at 3 points.  BMI is unchanged, the patient has been 60% compliant with CPAP use between 5 and 18 cmH2O with 3 cm EPR it turns out that she only requires 12 cmH2O pressure this is her 95th percentile. She does have minimal air leaks and a very much reduced apnea hypercapnia index.  The residual AHI is only 2.4/h.  She had a severe head cold last week and couldn't use it.  She has questions about facial puffiness, pressure marks . Uses a F20 FFM in medium- and has felt it was sealing well, but now it leaks ? We tried a small size airtouch mask and this seems to fit better. During her week of a head cold she felt extra congested, had puffy ankles, but she was in [facility] and ate out a lot, higher salt intake?    HPI: Crystal Green is a 57 y.o. female patient, seen here as in a referral from Dr. Jacinto Green / Dr.  Nicholos Green for evaluation of a syncope- and possible relation to sleep apnea/ snoring.  In 2013 she was evaluated for cardiovascular syncope, by Dr. Sandria Green and Dr Crystal Green, and no cause was found. She has had a tile table test at the time- negative.  New syncopal episodes have been present for many years and over the last 2 to 3 years more frequently.  Now she has 1 maybe every 8 weeks.  She has noticed episodes while visiting in [place] during the warm summer months.  Dizziness has been associated with loss of bowel control and vomiting.  No chest pain, shortness of breath - but chest tightness and cardiac palpitations were noted.  She will suddenly feel a lightheadedness sensation come over her and urgently has to seek out the next restroom.  At some time she has not lost consciousness with these events-  just near syncope.  She had tried propranolol which caused fatigue and on atenolol has noticed a mild improvement in her symptoms.  She has also had episodes now while at home and sometimes in places like a department store which should be air-conditioned and not hot and humid at all.  Her hands gets sweaty and she feels flushed and has explosive diarrhea- she had negative colonoscopies and neither irritable  bowel nor carcinoid were found. .  She has also not exercised as she is scared to induce a fainting spell.  She has experienced this as socially isolating -  She is not a previous tobacco user, has never been known to have any addiction problem, with either pain medication or alcohol.    Chief complaint according to patient : "near syncope with associated symptoms" . "I do snore " Sleep habits are as follows: [name] reports that she usually watches TV for the hours before she will retreat to bed.  Bedtime will be around 10-10.30 PM, but struggles to enter sleep.  She may be awake for 30, even 60 minutes.  She will sleep on her side, with one firm pillow for head support.  The bedroom is cool and  dark,and quiet. She does not watch TV in the bedroom.  When she struggles to go to sleep she usually struggles with some thoughts, some agitation, some stress.No restless legs reported. Once asleep she can stay asleep, but her husband wakes her up for loud snoring. No nocturia. She sleeps better when he travels. She dreams, not a lot.  She rises at 7.30 - 8 AM, spontaneously, no alarm is needed. Most nights she will have 7-8 hours of sleep.  Sleep medical history and family sleep history:  2 brothers , unsure of either has OSA. Father is 73 and always asleep.  No history of :Sleep walking, night terrors, neither enuresis. tonsillectomy as an adult , age 61, complicated by a hemorrhage.   Social history:  Married, 3  Adult children ages 49- 35, eldest daughter lives in [place] with a one year old granddaughter.   Non smoker, seldomly consuming alcohol,  Caffeine use;  2-3 glasses a week of unsweet tea , diet soda, not coffee.  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  Review of Systems: Out of a complete 14 system review, the patient complains of only the following symptoms, and all other reviewed systems are negative.  HTN-  No headaches on CPAP . No vertigo.  No Snoring, less neck pain, occipital pain.  Ankle edema.  Epworth score: 03/ 24 points  , Fatigue severity score 15/ 63 , depression score: N/a. No faiting, near fainting since last seen. Breathing now through her nose- less congested.     Social History   Socioeconomic History  . Marital status: Married    Spouse name: Not on file  . Number of children: Not on file  . Years of education: Not on file  . Highest education level: Not on file  Occupational History  . Not on file  Social Needs  . Financial resource strain: Not on file  . Food insecurity:    Worry: Not on file    Inability: Not on file  . Transportation needs:    Medical: Not on file    Non-medical: Not on file  Tobacco Use  . Smoking status: Never Smoker  .  Smokeless tobacco: Never Used  Substance and Sexual Activity  . Alcohol use: Yes    Comment: rarely  . Drug use: Not Currently  . Sexual activity: Not on file  Lifestyle  . Physical activity:    Days per week: Not on file    Minutes per session: Not on file  . Stress: Not on file  Relationships  . Social connections:    Talks on phone: Not on file    Gets together: Not on file    Attends religious service: Not  on file    Active member of club or organization: Not on file    Attends meetings of clubs or organizations: Not on file    Relationship status: Not on file  . Intimate partner violence:    Fear of current or ex partner: Not on file    Emotionally abused: Not on file    Physically abused: Not on file    Forced sexual activity: Not on file  Other Topics Concern  . Not on file  Social History Narrative  . Not on file    Family History  Problem Relation Age of Onset  . Hypertension Mother     Past Medical History:  Diagnosis Date  . Chronic kidney disease    stage 3  . Hypertension     Past Surgical History:  Procedure Laterality Date  . ABDOMINAL HYSTERECTOMY    . CATARACT EXTRACTION, BILATERAL  2003  . KNEE SURGERY Right 2012   arthroscopic  . LAPAROSCOPIC APPENDECTOMY  12/02/2011   Procedure: APPENDECTOMY LAPAROSCOPIC;  Surgeon: Emelia LoronMatthew Wakefield, MD;  [facility];  Service: General;  Laterality: N/A;  . TONSILLECTOMY      Current Outpatient Medications  Medication Sig Dispense Refill  . atenolol (TENORMIN) 50 MG tablet Take 50 mg by mouth 2 (two) times daily.  0  . cholestyramine (QUESTRAN) 4 GM/DOSE powder USE TWICE A DAY AS DIRECTED  12  . hyoscyamine (LEVSIN SL) 0.125 MG SL tablet PLACE 2 TABLETS UNDER TONGUE 4 TIMES A DAY AS NEEDED  3   No current facility-administered medications for this visit.     Allergies as of 07/10/2018 - Review Complete 07/10/2018  Allergen Reaction Noted  . Codeine Other (See Comments) 12/01/2011  . Sudafed  [pseudoephedrine] Other (See Comments) 12/01/2011  . Amoxicillin Rash 12/01/2011    Vitals: BP (!) 147/84   Pulse (!) 59   Ht 5\' 6"  (1.676 m)   Wt 241 lb (109.3 kg)   BMI 38.90 kg/m  Last Weight:  Wt Readings from Last 1 Encounters:  07/10/18 241 lb (109.3 kg)   ZOX:WRUEBMI:Body mass index is 38.9 kg/m.     Last Height:   Ht Readings from Last 1 Encounters:  07/10/18 5\' 6"  (1.676 m)    Physical exam:  General: The patient is awake, alert and appears not in acute distress. The patient is well groomed. Head: Normocephalic, atraumatic. Neck is supple. Mallampati 3 , wears braces.  neck circumference: 16"  . Nasal airflow is not longer congested - runny nose. coughing all improved ,  . Retrognathia is not seen.  Cardiovascular:  Regular rate and rhythm , without  murmurs or carotid bruit, and without distended neck veins. Respiratory: Lungs are clear to auscultation. Skin:  Without evidence of edema, or rash Trunk: BMI is 38 - "each pregnancy I gained and kept " The patient's posture is erect.   Neurologic exam : The patient is awake and alert, oriented to place and time.    Attention span & concentration ability appears normal.  Speech is fluent,  without dysarthria, dysphonia or aphasia.  Mood and affect are appropriate.  Cranial nerves:Pupils are equal and briskly reactive to light.Visual fields by finger perimetry are intact. Hearing to finger rub intact. Facial sensation intact to fine touch.Facial motor strength is symmetric, and tongue and uvula move midline. Shoulder shrug was symmetrical.  Motor exam:  Normal tone, muscle bulk and symmetric strength in all extremities. Sensory:  Fine touch, pinprick and vibration were tested in all  extremities. Proprioception tested in the upper extremities was normal. Coordination: Rapid alternating movements in the fingers/hands were normal. Finger-to-nose maneuver  normal without evidence of ataxia, dysmetria or tremor. Gait and station:  Patient walks without assistive device. Assessment:  After physical and neurologic examination, review of laboratory studies,  Personal review of imaging studies, reports of other /same  Imaging studies, results of polysomnography and / or neurophysiology testing and pre-existing records as far as provided in visit., my assessment is   1)  Obesity, large neck size and tested positive by HST for moderate  OSA.  Now on autotitration CPAP   2)  Neither excessive daytime sleepiness nor fatigue were reported. She remains with Epworth 3 points. She reports asthma-  Is higly allergic to perfumes and scents.  CPAP filtered air has helped.   3) syncope - not sure what leads to it but an attended  sleep study is indicated to evaluate heart rate, REM dependent apnea, etc.  No such data could be gathered form a HST ( her insurance is [facility])    The patient was advised of the nature of the diagnosed disorder , the treatment options and the  risks for general health and wellness arising from not treating the condition.   I spent more than 20 minutes of face to face time with the patient.  Greater than 50% of time was spent in counseling and coordination of care. We have discussed the diagnosis and differential and I answered the patient's questions.    Plan:  Treatment plan and additional workup :  Biotin mouth wash.  Netti pot  Adjust CPAP humidifier. See [place] in 12 month !     Melvyn Novas, MD   07/10/2018, 8:29 AM  Certified in Neurology by ABPN Certified in Sleep Medicine by [facility] [address], [address] [place], [place] [address]  Cc Dr. Jacinto Green

## 2018-07-19 DIAGNOSIS — G4733 Obstructive sleep apnea (adult) (pediatric): Secondary | ICD-10-CM | POA: Diagnosis not present

## 2018-08-18 DIAGNOSIS — G4733 Obstructive sleep apnea (adult) (pediatric): Secondary | ICD-10-CM | POA: Diagnosis not present

## 2018-09-11 DIAGNOSIS — G4733 Obstructive sleep apnea (adult) (pediatric): Secondary | ICD-10-CM | POA: Diagnosis not present

## 2018-09-18 DIAGNOSIS — G4733 Obstructive sleep apnea (adult) (pediatric): Secondary | ICD-10-CM | POA: Diagnosis not present

## 2018-10-19 DIAGNOSIS — G4733 Obstructive sleep apnea (adult) (pediatric): Secondary | ICD-10-CM | POA: Diagnosis not present

## 2018-11-17 DIAGNOSIS — G4733 Obstructive sleep apnea (adult) (pediatric): Secondary | ICD-10-CM | POA: Diagnosis not present

## 2018-12-18 DIAGNOSIS — G4733 Obstructive sleep apnea (adult) (pediatric): Secondary | ICD-10-CM | POA: Diagnosis not present

## 2018-12-24 DIAGNOSIS — N183 Chronic kidney disease, stage 3 (moderate): Secondary | ICD-10-CM | POA: Diagnosis not present

## 2018-12-24 DIAGNOSIS — I10 Essential (primary) hypertension: Secondary | ICD-10-CM | POA: Diagnosis not present

## 2018-12-24 DIAGNOSIS — E039 Hypothyroidism, unspecified: Secondary | ICD-10-CM | POA: Diagnosis not present

## 2018-12-24 DIAGNOSIS — R7303 Prediabetes: Secondary | ICD-10-CM | POA: Diagnosis not present

## 2018-12-31 DIAGNOSIS — I10 Essential (primary) hypertension: Secondary | ICD-10-CM | POA: Diagnosis not present

## 2018-12-31 DIAGNOSIS — N183 Chronic kidney disease, stage 3 (moderate): Secondary | ICD-10-CM | POA: Diagnosis not present

## 2018-12-31 DIAGNOSIS — R7303 Prediabetes: Secondary | ICD-10-CM | POA: Diagnosis not present

## 2019-06-18 ENCOUNTER — Telehealth: Payer: Self-pay | Admitting: Neurology

## 2019-06-18 NOTE — Telephone Encounter (Signed)
lvm to r/s 11/23 appt  °

## 2019-07-14 ENCOUNTER — Ambulatory Visit: Payer: 59 | Admitting: Neurology

## 2019-07-30 ENCOUNTER — Other Ambulatory Visit: Payer: Self-pay | Admitting: Internal Medicine

## 2019-07-30 DIAGNOSIS — Z1231 Encounter for screening mammogram for malignant neoplasm of breast: Secondary | ICD-10-CM

## 2019-08-02 ENCOUNTER — Ambulatory Visit
Admission: RE | Admit: 2019-08-02 | Discharge: 2019-08-02 | Disposition: A | Discharge status: Home / Self Care | Payer: Commercial Managed Care - HMO | Source: Ambulatory Visit | Attending: Internal Medicine | Admitting: Internal Medicine

## 2019-08-02 ENCOUNTER — Other Ambulatory Visit: Payer: Self-pay

## 2019-08-02 DIAGNOSIS — Z1231 Encounter for screening mammogram for malignant neoplasm of breast: Secondary | ICD-10-CM

## 2019-09-17 ENCOUNTER — Encounter: Payer: Self-pay | Admitting: Neurology

## 2019-09-17 ENCOUNTER — Ambulatory Visit (INDEPENDENT_AMBULATORY_CARE_PROVIDER_SITE_OTHER): Payer: 59 | Admitting: Neurology

## 2019-09-17 ENCOUNTER — Other Ambulatory Visit: Payer: Self-pay

## 2019-09-17 VITALS — BP 128/92 | HR 60 | Temp 97.4°F | Ht 66.0 in | Wt 243.0 lb

## 2019-09-17 DIAGNOSIS — H269 Unspecified cataract: Secondary | ICD-10-CM

## 2019-09-17 DIAGNOSIS — R55 Syncope and collapse: Secondary | ICD-10-CM

## 2019-09-17 DIAGNOSIS — Z9989 Dependence on other enabling machines and devices: Secondary | ICD-10-CM

## 2019-09-17 DIAGNOSIS — R002 Palpitations: Secondary | ICD-10-CM

## 2019-09-17 DIAGNOSIS — M25471 Effusion, right ankle: Secondary | ICD-10-CM | POA: Diagnosis not present

## 2019-09-17 DIAGNOSIS — I10 Essential (primary) hypertension: Secondary | ICD-10-CM

## 2019-09-17 DIAGNOSIS — G4733 Obstructive sleep apnea (adult) (pediatric): Secondary | ICD-10-CM | POA: Diagnosis not present

## 2019-09-17 DIAGNOSIS — M25472 Effusion, left ankle: Secondary | ICD-10-CM

## 2019-09-17 DIAGNOSIS — R0683 Snoring: Secondary | ICD-10-CM | POA: Diagnosis not present

## 2019-09-17 NOTE — Patient Instructions (Signed)
Allergies, Adult An allergy means that your body reacts to something that bothers it (allergen). It is not a normal reaction. This can happen from something that you:  Eat.  Breathe in.  Touch. You can have an allergy (be allergic) to:  Outdoor things, like: ? Pollen. ? Grass. ? Weeds.  Indoor things, like: ? Dust. ? Smoke. ? Pet dander.  Foods.  Medicines.  Things that bother your skin, like: ? Detergents. ? Chemicals. ? Latex.  Perfume.  Bugs. An allergy cannot spread from person to person (is not contagious). Follow these instructions at home:         Stay away from things that you know you are allergic to.  If you have allergies to things in the air, wash out your nose each day. Do it with one of these: ? A salt-water (saline) spray. ? A container (neti pot).  Take over-the-counter and prescription medicines only as told by your doctor.  Keep all follow-up visits as told by your doctor. This is important.  If you are at risk for a very bad allergy reaction (anaphylaxis), keep an auto-injector with you all the time. This is called an epinephrine injection. ? This is pre-measured medicine with a needle. You can put it into your skin by yourself. ? Right after you have a very bad allergy reaction, you or a person with you must give the medicine in less than a few minutes. This is an emergency.  If you have ever had a very bad allergy reaction, wear a medical alert bracelet or necklace. Your very bad allergy should be written on it. Contact a health care provider if:  Your symptoms do not get better with treatment. Get help right away if:  You have symptoms of a very bad allergy reaction. These include: ? A swollen mouth, tongue, or throat. ? Pain or tightness in your chest. ? Trouble breathing. ? Being short of breath. ? Dizziness. ? Fainting. ? Very bad pain in your belly (abdomen). ? Throwing up (vomiting). ? Watery poop  (diarrhea). Summary  An allergy means that your body reacts to something that bothers it (allergen). It is not a normal reaction.  Stay away from things that make your body react.  Take over-the-counter and prescription medicines only as told by your doctor.  If you are at risk for a very bad allergy reaction, carry an auto-injector (epinephrine injection) all the time. Also, wear a medical alert bracelet or necklace so people know about your allergy. This information is not intended to replace advice given to you by your health care provider. Make sure you discuss any questions you have with your health care provider. Document Revised: 11/26/2018 Document Reviewed: 11/20/2016 Elsevier Patient Education  2020 [employer] Inc.  

## 2019-09-17 NOTE — Progress Notes (Signed)
SLEEP MEDICINE CLINIC   Provider:  Melvyn Novas, M.D.   Primary Care Physician:  Georgianne Fick, MD   Referring Provider: Georgianne Fick, MD   Chief Complaint  Patient presents with  . Follow-up    PT. alone, rm 10. Had  HST and is on auto CPAP now. pt still having difficulty with the mask. she has tried different ones with [employer] but they dont tend to last long and leaks from the mask will wake her. she also mentions pressure increasing and being too much at time   I meet with Crystal Green today, 09-17-2019, regular compliance visit.  Her last visit with [place] was on 20 November after she had started using an auto titration CPAP machine.  She had undergone a sleep study on March 13, 2018 which had confirmed apnea, she had also a near syncope spell at the time.   The patient has been working from home, she has used the CPAP compliantly 87% 26 out of 30 days with an average of 5 hours and 47 minutes nocturnal use.  The AutoSet is set between a minimum pressure of 5 and a maximum pressure of 18 cmH2O this recent meter EPR.  AHI is 2.8/h which is a considerable reduction in apnea the vast majority of residual apneas are still obstructive in nature the 95th percentile pressure is 13 cmH2O which surprises me.  She has minor air leaks and she has tried several masks with [employer] in the meantime and finally seems to have found something that works reasonably well.   I have the pleasure of meeting today on 10 July 2018 with [name], a 59 year old Caucasian female patient of Dr. Carolyn Stare, following up after a home sleep test.  The patient had a home sleep study on 13 March 2018 with a total recording time of 8 hours and 24 minutes and a valid test time of 6 hours and 48 minutes, study revealed an AHI of 31.3, an average oxygen saturation of 94% with a nadir of 83.  The patient only spent 1.4 minutes of the total sleep time in desaturation but her heart rate varied greatly between  44 and 106 bpm also it was sinus rhythm.  CPAP therapy was recommended and the patient started in autotitrator between 5 and 18 cmH2O pressure pre-CPAP Epworth sleepiness score was endorsed at 3 points.  BMI is unchanged, the patient has been 60% compliant with CPAP use between 5 and 18 cmH2O with 3 cm EPR it turns out that she only requires 12 cmH2O pressure this is her 95th percentile. She does have minimal air leaks and a very much reduced apnea hypercapnia index.  The residual AHI is only 2.4/h.  She had a severe head cold last week and couldn't use it.  She has questions about facial puffiness, pressure marks . Uses a F20 FFM in medium- and has felt it was sealing well, but now it leaks ? We tried a small size airtouch mask and this seems to fit better. During her week of a head cold she felt extra congested, had puffy ankles, but she was in [facility] and ate out a lot, higher salt intake?    HPI: Crystal Green is a 59 y.o. female patient, seen here as in a referral from Dr. Jacinto Halim / Dr. Nicholos Johns for evaluation of a syncope- and possible relation to sleep apnea/ snoring.  In 2013 she was evaluated for cardiovascular syncope, by Dr. Sandria Manly and Dr Jacinto Halim, and no cause  was found. She has had a tile table test at the time- negative.  New syncopal episodes have been present for many years and over the last 2 to 3 years more frequently.  Now she has 1 maybe every 8 weeks.  She has noticed episodes while visiting in [place] during the warm summer months.  Dizziness has been associated with loss of bowel control and vomiting.  No chest pain, shortness of breath - but chest tightness and cardiac palpitations were noted.  She will suddenly feel a lightheadedness sensation come over her and urgently has to seek out the next restroom.  At some time she has not lost consciousness with these events-  just near syncope.  She had tried propranolol which caused fatigue and on atenolol has noticed a mild improvement in her  symptoms.  She has also had episodes now while at home and sometimes in places like a department store which should be air-conditioned and not hot and humid at all.  Her hands gets sweaty and she feels flushed and has explosive diarrhea- she had negative colonoscopies and neither irritable bowel nor carcinoid were found. .  She has also not exercised as she is scared to induce a fainting spell.  She has experienced this as socially isolating -  She is not a previous tobacco user, has never been known to have any addiction problem, with either pain medication or alcohol.    Chief complaint according to patient : "near syncope with associated symptoms" . "I do snore " Sleep habits are as follows: Crystal Green reports that she usually watches TV for the hours before she will retreat to bed.  Bedtime will be around 10-10.30 PM, but struggles to enter sleep.  She may be awake for 30, even 60 minutes.  She will sleep on her side, with one firm pillow for head support.  The bedroom is cool and dark,and quiet. She does not watch TV in the bedroom.  When she struggles to go to sleep she usually struggles with some thoughts, some agitation, some stress.No restless legs reported. Once asleep she can stay asleep, but her husband wakes her up for loud snoring. No nocturia. She sleeps better when he travels. She dreams, not a lot.  She rises at 7.30 - 8 AM, spontaneously, no alarm is needed. Most nights she will have 7-8 hours of sleep.  Sleep medical history and family sleep history:  2 brothers , unsure of either has OSA. Father is 69 and always asleep.  No history of :Sleep walking, night terrors, neither enuresis. tonsillectomy as an adult , age 38, complicated by a hemorrhage.   Social history:  Married, 3  Adult children ages 23- 79, eldest daughter lives in [place] with a two year old granddaughter.  Her youngest child has let the 'nest'.  Her dog has passed.  Non smoker, seldomly consuming alcohol,  Caffeine use;   2-3 glasses a week of unsweet tea , diet soda, not coffee. 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  Review of Systems: Out of a complete 14 system review, the patient complains of only the following symptoms, and all other reviewed systems are negative.  HTN-  No headaches on CPAP . No vertigo. Today's Epworth sleepiness score was endorsed at only 4 points and fatigue severity at only 13 points both indicating no critical level.   She wakes sometimes from air leaking. She has ben hoarse for month- she cannot longer sing in the choir.  She no longer has  condensation water in the tube, using a heated tube.  She reports having a lot of phlegm.  She did not get answers from allergic testing.    Social History   Socioeconomic History  . Marital status: Married    Spouse name: Not on file  . Number of children: Not on file  . Years of education: Not on file  . Highest education level: Not on file  Occupational History  . Not on file  Tobacco Use  . Smoking status: Never Smoker  . Smokeless tobacco: Never Used  Substance and Sexual Activity  . Alcohol use: Yes    Comment: rarely  . Drug use: Not Currently  . Sexual activity: Not on file  Other Topics Concern  . Not on file  Social History Narrative  . Not on file   Social Determinants of Health   Financial Resource Strain:   . Difficulty of Paying Living Expenses: Not on file  Food Insecurity:   . Worried About Charity fundraiser in the Last Year: Not on file  . Ran Out of Food in the Last Year: Not on file  Transportation Needs:   . Lack of Transportation (Medical): Not on file  . Lack of Transportation (Non-Medical): Not on file  Physical Activity:   . Days of Exercise per Week: Not on file  . Minutes of Exercise per Session: Not on file  Stress:   . Feeling of Stress : Not on file  Social Connections:   . Frequency of Communication with Friends and Family: Not on file  . Frequency of Social Gatherings with Friends and  Family: Not on file  . Attends Religious Services: Not on file  . Active Member of Clubs or Organizations: Not on file  . Attends Archivist Meetings: Not on file  . Marital Status: Not on file  Intimate Partner Violence:   . Fear of Current or Ex-Partner: Not on file  . Emotionally Abused: Not on file  . Physically Abused: Not on file  . Sexually Abused: Not on file    Family History  Problem Relation Age of Onset  . Hypertension Mother     Past Medical History:  Diagnosis Date  . Chronic kidney disease    stage 3  . Hypertension     Past Surgical History:  Procedure Laterality Date  . ABDOMINAL HYSTERECTOMY    . CATARACT EXTRACTION, BILATERAL  2003  . KNEE SURGERY Right 2012   arthroscopic  . LAPAROSCOPIC APPENDECTOMY  12/02/2011   Procedure: APPENDECTOMY LAPAROSCOPIC;  Surgeon: Rolm Bookbinder, MD;  Location: [facility];  Service: General;  Laterality: N/A;  . TONSILLECTOMY      Current Outpatient Medications  Medication Sig Dispense Refill  . atenolol (TENORMIN) 50 MG tablet Take 50 mg by mouth 2 (two) times daily.  0  . cholestyramine (QUESTRAN) 4 GM/DOSE powder USE TWICE A DAY AS DIRECTED  12  . hyoscyamine (LEVSIN SL) 0.125 MG SL tablet PLACE 2 TABLETS UNDER TONGUE 4 TIMES A DAY AS NEEDED  3   No current facility-administered medications for this visit.    Allergies as of 09/17/2019 - Review Complete 09/17/2019  Allergen Reaction Noted  . Codeine Other (See Comments) 12/01/2011  . Sudafed [pseudoephedrine] Other (See Comments) 12/01/2011  . Amoxicillin Rash 12/01/2011    Vitals: BP (!) 128/92   Pulse 60   Temp (!) 97.4 F (36.3 C)   Ht 5\' 6"  (1.676 m)   Wt 243 lb (110.2 kg)  BMI 39.22 kg/m  Last Weight:  Wt Readings from Last 1 Encounters:  09/17/19 243 lb (110.2 kg)   YBW:LSLH mass index is 39.22 kg/m.     Last Height:   Ht Readings from Last 1 Encounters:  09/17/19 5\' 6"  (1.676 m)    Physical exam:  General: The patient is awake,  alert and appears not in acute distress. The patient is well groomed. Head: Normocephalic, atraumatic. Neck is supple. Mallampati 3- neck circumference: 16"  . Nasal airflow is not longer congested - runny nose. coughing had improved , now returned.  Hoarse ness persistent.  Her sinu are clear. Retrognathia is not seen.  Cardiovascular:  Regular rate and rhythm , without  murmurs or carotid bruit, and without distended neck veins. Respiratory: Lungs are clear to auscultation. Skin:  Without evidence of edema, or rash Trunk: BMI is 38 - "each pregnancy I gained and kept " The patient's posture is erect.   Neurologic exam : The patient is awake and alert, oriented to place and time.   normal.  Speech is fluent,  without dysarthria, but with dysphonia  and affect are appropriate.   No loss of taste or smell.  Cranial nerves:Pupils are equal and briskly reactive to light.Visual fields by finger perimetry are intact. Hearing to finger rub intact. Facial motor strength is symmetric, and tongue and uvula moving in midline. Shoulder shrug was symmetrical.  Motor exam:  Normal tone, muscle bulk and symmetric strength in all extremities. Sensory:  Fine touch, pinprick and vibration were normal. Coordination: no evidence of ataxia, dysmetria or tremor. Gait and station: Patient walks without assistive device.      Assessment:  After physical and neurologic examination, review of laboratory studies,  Personal review of imaging studies, reports of other /same  Imaging studies, results of polysomnography and / or neurophysiology testing and pre-existing records as far as provided in visit., my assessment is    Obesity,  BMI 39 kg/m2 - dropped pounds since new years .  Patient tested positive by HST for moderate OSA.  Now on autotitration CPAP, FFM well tolerated.  Neither excessive daytime sleepiness nor fatigue were reported. I reset the humidifier to level 4.  Hoarseness- humidity is set to middle  level, heated tube is used, may be thi sis related to allergies.  Start on Claritin or Allegra  once a day,  If hoarseness improved on anti-allergy medication-   The patient was advised of the nature of the diagnosed disorder , the treatment options and the  risks for general health and wellness arising from not treating the condition.   I spent more than 25 minutes of face to face time with the patient.  Greater than 50% of time was spent in counseling and coordination of care. We have discussed the diagnosis and differential and I answered the patient's questions.    Plan:  Treatment plan and additional workup : Rv in 12 month .     , MD   09/17/2019, 2:53 PM  Certified in Neurology by ABPN Certified in Sleep Medicine by [facility] [address] [place], [place] [id number]

## 2020-09-10 DIAGNOSIS — S8001XA Contusion of right knee, initial encounter: Secondary | ICD-10-CM | POA: Diagnosis not present

## 2020-09-10 DIAGNOSIS — M1711 Unilateral primary osteoarthritis, right knee: Secondary | ICD-10-CM | POA: Diagnosis not present

## 2020-09-16 ENCOUNTER — Ambulatory Visit: Payer: BC Managed Care – PPO | Admitting: Adult Health

## 2020-09-16 ENCOUNTER — Encounter: Payer: Self-pay | Admitting: Adult Health

## 2020-09-16 VITALS — BP 125/72 | HR 65 | Ht 66.0 in | Wt 244.0 lb

## 2020-09-16 DIAGNOSIS — Z9989 Dependence on other enabling machines and devices: Secondary | ICD-10-CM | POA: Diagnosis not present

## 2020-09-16 DIAGNOSIS — G4733 Obstructive sleep apnea (adult) (pediatric): Secondary | ICD-10-CM

## 2020-09-16 NOTE — Progress Notes (Signed)
PATIENT: Crystal Green DOB: 05-29-1961  REASON FOR VISIT: follow up HISTORY FROM: patient  HISTORY OF PRESENT ILLNESS: Today 09/16/20: Crystal Green is a 60 year old female with a history of obstructive sleep apnea on CPAP.  She reports that she is having a hard time tolerating her mask.  She states that it becomes worse after 2 weeks of using it.  She states that she wakes up several times at night having to adjust the mask to keep it from leaking.  She would like to try a different style of mask.  Compliance Report Usage 08/17/2020 - 09/15/2020 Usage days 29/30 days (97%) >= 4 hours 28 days (93%) Average usage (days used) 7 hours 23 minutes  AirSense 10 AutoSet Serial number [id number] Mode AutoSet Min Pressure 5 cmH2O Max Pressure 18 cmH2O EPR Fulltime EPR level 3  Therapy Pressure - cmH2O Median: 9.6 95th percentile: 12.0 Maximum: 13.5 Leaks - L/min Median: 0.0 95th percentile: 7.4 Maximum: 38.2 Events per hour AI: 2.2 HI: 0.8 AHI: 3.0 Apnea Index Central: 0.4 Obstructive: 1.7 Unknown: 0.0  HISTORY 09-17-2019, regular compliance visit.  Her last visit with [place] was on 20 November after she had started using an auto titration CPAP machine.  She had undergone a sleep study on March 13, 2018 which had confirmed apnea, she had also a near syncope spell at the time.   The patient has been working from home, she has used the CPAP compliantly 87% 26 out of 30 days with an average of 5 hours and 47 minutes nocturnal use.  The AutoSet is set between a minimum pressure of 5 and a maximum pressure of 18 cmH2O this recent meter EPR.  AHI is 2.8/h which is a considerable reduction in apnea the vast majority of residual apneas are still obstructive in nature the 95th percentile pressure is 13 cmH2O which surprises me.  She has minor air leaks and she has tried several masks with [employer] in the meantime and finally seems to have found something that works reasonably well.    REVIEW OF SYSTEMS: Out  of a complete 14 system review of symptoms, the patient complains only of the following symptoms, and all other reviewed systems are negative.  FSS 9 ESS 3  ALLERGIES: Allergies  Allergen Reactions  . Codeine Other (See Comments)    jittery  . Sudafed [Pseudoephedrine] Other (See Comments)    jittery  . Amoxicillin Rash    HOME MEDICATIONS: Outpatient Medications Prior to Visit  Medication Sig Dispense Refill  . atenolol (TENORMIN) 50 MG tablet Take 50 mg by mouth 2 (two) times daily.  0  . cholestyramine (QUESTRAN) 4 GM/DOSE powder As needed  12  . hyoscyamine (LEVSIN SL) 0.125 MG SL tablet PLACE 2 TABLETS UNDER TONGUE 4 TIMES A DAY AS NEEDED  3   No facility-administered medications prior to visit.    PAST MEDICAL HISTORY: Past Medical History:  Diagnosis Date  . Chronic kidney disease    stage 3  . Hypertension     PAST SURGICAL HISTORY: Past Surgical History:  Procedure Laterality Date  . ABDOMINAL HYSTERECTOMY    . CATARACT EXTRACTION, BILATERAL  2003  . KNEE SURGERY Right 2012   arthroscopic  . LAPAROSCOPIC APPENDECTOMY  12/02/2011   Procedure: APPENDECTOMY LAPAROSCOPIC;  Surgeon: Emelia Loron, MD;  Location: [facility];  Service: General;  Laterality: N/A;  . TONSILLECTOMY      FAMILY HISTORY: Family History  Problem Relation Age of Onset  . Hypertension Mother  SOCIAL HISTORY: Social History   Socioeconomic History  . Marital status: Married    Spouse name: Not on file  . Number of children: Not on file  . Years of education: Not on file  . Highest education level: Not on file  Occupational History  . Not on file  Tobacco Use  . Smoking status: Never Smoker  . Smokeless tobacco: Never Used  Substance and Sexual Activity  . Alcohol use: Yes    Comment: rarely  . Drug use: Not Currently  . Sexual activity: Not on file  Other Topics Concern  . Not on file  Social History Narrative  . Not on file   Social Determinants of Health    Financial Resource Strain: Not on file  Food Insecurity: Not on file  Transportation Needs: Not on file  Physical Activity: Not on file  Stress: Not on file  Social Connections: Not on file  Intimate Partner Violence: Not on file      PHYSICAL EXAM  Vitals:   09/16/20 1331  BP: 125/72  Pulse: 65  Weight: 244 lb (110.7 kg)  Height: 5\' 6"  (1.676 m)   Body mass index is 39.38 kg/m.  Generalized: Well developed, in no acute distress  Chest: Lungs clear to auscultation bilaterally  Neurological examination  Mentation: Alert oriented to time, place, history taking. Follows all commands speech and language fluent Cranial nerve II-XII: Extraocular movements were full, visual field were full on confrontational test Head turning and shoulder shrug  were normal and symmetric. Motor: The motor testing reveals 5 over 5 strength of all 4 extremities. Good symmetric motor tone is noted throughout.  Sensory: Sensory testing is intact to soft touch on all 4 extremities. No evidence of extinction is noted.  Gait and station: Gait is normal.    DIAGNOSTIC DATA (LABS, IMAGING, TESTING) - I reviewed patient records, labs, notes, testing and imaging myself where available.  Lab Results  Component Value Date   WBC 13.4 (H) 12/01/2011   HGB 12.8 12/01/2011   HCT 38.3 12/01/2011   MCV 89.5 12/01/2011   PLT 208 12/01/2011      Component Value Date/Time   NA 140 12/01/2011 1514   K 3.9 12/01/2011 1514   CL 104 12/01/2011 1514   CO2 23 12/01/2011 1514   GLUCOSE 122 (H) 12/01/2011 1514   BUN 15 12/01/2011 1514   CREATININE 0.79 12/01/2011 1514   CALCIUM 9.2 12/01/2011 1514   PROT 7.4 12/01/2011 1514   ALBUMIN 4.1 12/01/2011 1514   AST 20 12/01/2011 1514   ALT 15 12/01/2011 1514   ALKPHOS 114 12/01/2011 1514   BILITOT 1.0 12/01/2011 1514   GFRNONAA >90 12/01/2011 1514   GFRAA >90 12/01/2011 1514   No results found for: CHOL, HDL, LDLCALC, LDLDIRECT, TRIG, CHOLHDL No results  found for: HGBA1C No results found for: VITAMINB12 No results found for: TSH    ASSESSMENT AND PLAN 60 y.o. year old female  has a past medical history of Chronic kidney disease and Hypertension. here with:  1. OSA on CPAP  - CPAP compliance excellent - Good treatment of AHI  -Order sent for mask refitting - Encourage patient to use CPAP nightly and > 4 hours each night - F/U in 1 year or sooner if needed   I spent 25 minutes of face-to-face and non-face-to-face time with patient.  This included previsit chart review, lab review, study review, order entry, electronic health record documentation, patient education.  41, MSN, NP-C 09/16/2020,  1:40 PM [facility] [address] [place], [place] [address] [phone]

## 2020-09-16 NOTE — Patient Instructions (Addendum)
Continue using CPAP nightly and greater than 4 hours each night.  °Mask refitting °If your symptoms worsen or you develop new symptoms please let us know.  ° °

## 2020-09-20 ENCOUNTER — Other Ambulatory Visit: Payer: Self-pay | Admitting: Internal Medicine

## 2020-09-20 DIAGNOSIS — Z1231 Encounter for screening mammogram for malignant neoplasm of breast: Secondary | ICD-10-CM

## 2020-10-06 DIAGNOSIS — G4733 Obstructive sleep apnea (adult) (pediatric): Secondary | ICD-10-CM | POA: Diagnosis not present

## 2020-10-07 DIAGNOSIS — K580 Irritable bowel syndrome with diarrhea: Secondary | ICD-10-CM | POA: Diagnosis not present

## 2020-10-07 DIAGNOSIS — Z86010 Personal history of colonic polyps: Secondary | ICD-10-CM | POA: Diagnosis not present

## 2020-10-07 DIAGNOSIS — R197 Diarrhea, unspecified: Secondary | ICD-10-CM | POA: Diagnosis not present

## 2020-11-03 DIAGNOSIS — G4733 Obstructive sleep apnea (adult) (pediatric): Secondary | ICD-10-CM | POA: Diagnosis not present

## 2020-11-08 DIAGNOSIS — Z01812 Encounter for preprocedural laboratory examination: Secondary | ICD-10-CM | POA: Diagnosis not present

## 2020-11-09 ENCOUNTER — Ambulatory Visit
Admission: RE | Admit: 2020-11-09 | Discharge: 2020-11-09 | Disposition: A | Discharge status: Home / Self Care | Payer: 59 | Source: Ambulatory Visit | Attending: Internal Medicine | Admitting: Internal Medicine

## 2020-11-09 ENCOUNTER — Other Ambulatory Visit: Payer: Self-pay

## 2020-11-09 DIAGNOSIS — Z1231 Encounter for screening mammogram for malignant neoplasm of breast: Secondary | ICD-10-CM | POA: Diagnosis not present

## 2020-11-11 DIAGNOSIS — K5520 Angiodysplasia of colon without hemorrhage: Secondary | ICD-10-CM | POA: Diagnosis not present

## 2020-11-11 DIAGNOSIS — K648 Other hemorrhoids: Secondary | ICD-10-CM | POA: Diagnosis not present

## 2020-11-11 DIAGNOSIS — K5730 Diverticulosis of large intestine without perforation or abscess without bleeding: Secondary | ICD-10-CM | POA: Diagnosis not present

## 2020-11-11 DIAGNOSIS — Z86010 Personal history of colonic polyps: Secondary | ICD-10-CM | POA: Diagnosis not present

## 2021-01-14 DIAGNOSIS — M25531 Pain in right wrist: Secondary | ICD-10-CM | POA: Diagnosis not present

## 2021-01-14 DIAGNOSIS — M25532 Pain in left wrist: Secondary | ICD-10-CM | POA: Diagnosis not present

## 2021-02-08 ENCOUNTER — Telehealth: Payer: Self-pay | Admitting: Adult Health

## 2021-02-08 NOTE — Telephone Encounter (Signed)
Pt is asking to be connected to another DME, please call pt to discuss.

## 2021-02-09 ENCOUNTER — Telehealth: Payer: Self-pay

## 2021-02-09 NOTE — Telephone Encounter (Signed)
error 

## 2021-02-09 NOTE — Telephone Encounter (Signed)
Returned pt call, no answer, LVM asking her to give me a call back

## 2021-02-10 NOTE — Telephone Encounter (Signed)
Pt returned call. Please call back when available. 

## 2021-02-11 NOTE — Telephone Encounter (Signed)
Contacted pt back, LVM asking her to return my call

## 2021-02-11 NOTE — Telephone Encounter (Signed)
Pt returned called, I asked her what's going on for her to want to change DMEs, she states her insurance has changed. [employer] sent her supplies and charged her almost $200 for, which were the wrong supplies and they wont take them back . She has had multiple complications with them trying to get prices and supplies correct and addiment about changing companies. I recommended [facility] to her, she was fine with this and would like the change. Her machine is paid in full.

## 2021-02-15 ENCOUNTER — Other Ambulatory Visit: Payer: Self-pay | Admitting: Neurology

## 2021-02-15 DIAGNOSIS — G4733 Obstructive sleep apnea (adult) (pediatric): Secondary | ICD-10-CM

## 2021-02-15 DIAGNOSIS — R55 Syncope and collapse: Secondary | ICD-10-CM

## 2021-02-15 DIAGNOSIS — R0683 Snoring: Secondary | ICD-10-CM

## 2021-02-15 NOTE — Telephone Encounter (Signed)
Order has been placed for transfer of care order for the patient. Order sent to [facility]).

## 2021-02-22 DIAGNOSIS — G4733 Obstructive sleep apnea (adult) (pediatric): Secondary | ICD-10-CM | POA: Diagnosis not present

## 2021-03-15 DIAGNOSIS — G4733 Obstructive sleep apnea (adult) (pediatric): Secondary | ICD-10-CM | POA: Diagnosis not present

## 2021-05-18 NOTE — Progress Notes (Signed)
Primary Physician/Referring:  Georgianne Fick, MD  [name], female    DOB: 1961-06-06, 60 y.o.   MRN: [id number]  Chief Complaint  Patient presents with   Hypertension   New Patient (Initial Visit)   HPI:    Crystal Green  is a 60 y.o. Caucasian female with history of hypertension, stage II CKD, prediabetes, obesity, sleep apnea (on CPAP).  Patient was last seen in our office 01/17/2018 for near cardiogenic syncope, at that time she was started on atenolol and symptoms significantly improved.  She does continue to have her episodes of palpitations and near syncope, however these have been well controlled over the last few years.  Patient presents to our office to reestablish care as she has been concerned regarding fatigue as well as difficulty losing weight over the last few years, which she feels is associated with initiation of atenolol.  She is also concerned as her blood pressure is occasionally elevated.  She presently has episodes of palpitations occurring weekly approximately 1-2 times lasting several seconds and relieved with cough.  In the past patient has been unable to tolerate metoprolol or propranolol due to fatigue.  Patient is requesting changing her atenolol.  Past Medical History:  Diagnosis Date   Chronic kidney disease    stage 3   Hypertension    Past Surgical History:  Procedure Laterality Date   ABDOMINAL HYSTERECTOMY     CATARACT EXTRACTION, BILATERAL  2003   KNEE SURGERY Right 2012   arthroscopic   LAPAROSCOPIC APPENDECTOMY  12/02/2011   Procedure: APPENDECTOMY LAPAROSCOPIC;  Surgeon: Emelia Loron, MD;  Location: [facility] OR;  Service: General;  Laterality: N/A;   TONSILLECTOMY     Family History  Problem Relation Age of Onset   Hypertension Mother     Social History   Tobacco Use   Smoking status: Never   Smokeless tobacco: Never  Substance Use Topics   Alcohol use: Yes    Comment: rarely   Marital Status: Married   ROS   Review of Systems  Constitutional: Negative for malaise/fatigue and weight gain.  Cardiovascular:  Positive for near-syncope (rarely, well controlled on atenolol) and palpitations. Negative for chest pain, claudication, leg swelling, orthopnea, paroxysmal nocturnal dyspnea and syncope.  Respiratory:  Negative for shortness of breath.   Neurological:  Negative for dizziness.   Objective  Blood pressure (!) 175/74, pulse 62, temperature 98 F (36.7 C), temperature source Temporal, height 5\' 6"  (1.676 m), weight 250 lb (113.4 kg), SpO2 98 %.  Vitals with BMI 05/23/2021 05/23/2021 09/16/2020  Height - 5\' 6"  5\' 6"   Weight - 250 lbs 244 lbs  BMI - 40.37 39.4  Systolic 175 158 09/18/2020  Diastolic 74 74 72  Pulse 62 72 65    Physical Exam Vitals reviewed.  Constitutional:      Appearance: She is obese.  HENT:     Head: Normocephalic and atraumatic.  Cardiovascular:     Rate and Rhythm: Normal rate and regular rhythm.     Pulses: Intact distal pulses.     Heart sounds: S1 normal and S2 normal. No murmur heard.   No gallop.  Pulmonary:     Effort: Pulmonary effort is normal. No respiratory distress.     Breath sounds: No wheezing, rhonchi or rales.  Musculoskeletal:     Right lower leg: No edema.     Left lower leg: No edema.  Neurological:     Mental Status: She is alert.  Laboratory examination:   No results for input(s): NA, K, CL, CO2, GLUCOSE, BUN, CREATININE, CALCIUM, GFRNONAA, GFRAA in the last 8760 hours. CrCl cannot be calculated (Patient's most recent lab result is older than the maximum 21 days allowed.).  CMP Latest Ref Rng & Units 12/01/2011  Glucose 70 - 99 mg/dL 765(Y)  BUN 6 - 23 mg/dL 15  Creatinine 6.50 - 3.54 mg/dL 6.56  Sodium 812 - 751 mEq/L 140  Potassium 3.5 - 5.1 mEq/L 3.9  Chloride 96 - 112 mEq/L 104  CO2 19 - 32 mEq/L 23  Calcium 8.4 - 10.5 mg/dL 9.2  Total Protein 6.0 - 8.3 g/dL 7.4  Total Bilirubin 0.3 - 1.2 mg/dL 1.0  Alkaline Phos 39 - 117 U/L 114   AST 0 - 37 U/L 20  ALT 0 - 35 U/L 15   CBC Latest Ref Rng & Units 12/01/2011 08/08/2010 06/29/2010  WBC 4.0 - 10.5 K/uL 13.4(H) 6.1 -  Hemoglobin 12.0 - 15.0 g/dL 70.0 11.7(L) 12.4  Hematocrit 36.0 - 46.0 % 38.3 35.0(L) -  Platelets 150 - 400 K/uL 208 249 -    Lipid Panel No results for input(s): CHOL, TRIG, LDLCALC, VLDL, HDL, CHOLHDL, LDLDIRECT in the last 8760 hours.  HEMOGLOBIN A1C No results found for: HGBA1C, MPG TSH No results for input(s): TSH in the last 8760 hours.  External labs:  Requested from PCP  Allergies   Allergies  Allergen Reactions   Sudafed [Pseudoephedrine] Other (See Comments)    jittery   Amoxicillin Rash   Codeine Other (See Comments)    jittery   Penicillins Rash    Medications Prior to Visit:   Outpatient Medications Prior to Visit  Medication Sig Dispense Refill   cholestyramine (QUESTRAN) 4 GM/DOSE powder As needed  12   atenolol (TENORMIN) 50 MG tablet Take 50 mg by mouth 2 (two) times daily.  0   hyoscyamine (LEVSIN SL) 0.125 MG SL tablet PLACE 2 TABLETS UNDER TONGUE 4 TIMES A DAY AS NEEDED (Patient not taking: Reported on 05/23/2021)  3   No facility-administered medications prior to visit.   Final Medications at End of Visit    Current Meds  Medication Sig   cholestyramine (QUESTRAN) 4 GM/DOSE powder As needed   verapamil (CALAN-SR) 120 MG CR tablet Take 1 tablet (120 mg total) by mouth at bedtime.   [DISCONTINUED] atenolol (TENORMIN) 50 MG tablet Take 50 mg by mouth 2 (two) times daily.   Radiology:   No results found.  Cardiac Studies:   Bilateral carotid artery 11/01/2017: No significant stenosis bilaterally.  Echocardiogram 11/01/2017: LVEF 65-70% Mild LAE Mild LVH, possible diastolic dysfunction.  EKG:   05/23/2021: Sinus rhythm at a rate of 61 bpm.  Normal axis.  Nonspecific ST-T wave abnormalities, cannot exclude lateral ischemia. Compared to 11/26/2017, no significant change.  Assessment     ICD-10-CM   1.  Essential hypertension  I10 EKG 12-Lead    2. LVH (left ventricular hypertrophy)  I51.7 PCV ECHOCARDIOGRAM COMPLETE    3. Palpitations  R00.2     4. Neurocardiogenic pre-syncope  R55        Medications Discontinued During This Encounter  Medication Reason   hyoscyamine (LEVSIN SL) 0.125 MG SL tablet Error   atenolol (TENORMIN) 50 MG tablet Change in therapy    Meds ordered this encounter  Medications   verapamil (CALAN-SR) 120 MG CR tablet    Sig: Take 1 tablet (120 mg total) by mouth at bedtime.    Dispense:  30 tablet    Refill:  3   Recommendations:   Crystal Green is a 60 y.o. Caucasian female with history of hypertension, stage II CKD, prediabetes, obesity, sleep apnea (on CPAP).  Patient was last seen in our office 01/17/2018 for near cardiogenic syncope, at that time she was started on atenolol and symptoms significantly improved.  She does continue to have her episodes of palpitations and near syncope, however these have been well controlled over the last few years.  Patient presents to our office to reestablish care as she has been concerned regarding fatigue as well as difficulty losing weight over the last few years, which she feels is associated with initiation of atenolol.  Patient's blood pressure is elevated in the office today, however review of records shows that it was well controlled at last PCP visit.  Encourage patient to obtain home blood pressure monitor and monitor blood pressure on a daily basis.  Requested the patient bring a written log of blood pressures with her to her next office visit.  We will discontinue atenolol and switch patient to verapamil 120 mg daily.  We will also obtain repeat echocardiogram as echo from 2019 reviewed mild LVH and given her fatigue would like to reevaluate.   Notably patient has had had no recurrence of syncope in the last 2 years.   Follow-up in 3 months, sooner if needed, for palpitations, hypertension.   Rayford Halsted, PA-C 05/23/2021, 4:42 PM Office: [phone]

## 2021-05-23 ENCOUNTER — Encounter: Payer: Self-pay | Admitting: Student

## 2021-05-23 ENCOUNTER — Ambulatory Visit: Payer: BC Managed Care – PPO | Admitting: Student

## 2021-05-23 ENCOUNTER — Other Ambulatory Visit: Payer: Self-pay

## 2021-05-23 VITALS — BP 175/74 | HR 62 | Temp 98.0°F | Ht 66.0 in | Wt 250.0 lb

## 2021-05-23 DIAGNOSIS — I10 Essential (primary) hypertension: Secondary | ICD-10-CM

## 2021-05-23 DIAGNOSIS — I517 Cardiomegaly: Secondary | ICD-10-CM | POA: Diagnosis not present

## 2021-05-23 DIAGNOSIS — R002 Palpitations: Secondary | ICD-10-CM | POA: Diagnosis not present

## 2021-05-23 DIAGNOSIS — R55 Syncope and collapse: Secondary | ICD-10-CM

## 2021-05-23 MED ORDER — VERAPAMIL HCL ER 120 MG PO TBCR: 120.0000 mg | EXTENDED_RELEASE_TABLET | Freq: Every day | ORAL | 3 refills | Status: DC

## 2021-05-24 ENCOUNTER — Other Ambulatory Visit: Payer: Self-pay | Admitting: Student

## 2021-05-24 MED ORDER — ATORVASTATIN CALCIUM 40 MG PO TABS: 40.0000 mg | ORAL_TABLET | Freq: Every day | ORAL | 11 refills | Status: DC

## 2021-05-24 NOTE — Progress Notes (Signed)
07/19/2020: TSH 5.06 Hgb 5.0, HCT 37.3, platelet 220, MCV 90.1 BUN 12, creatinine 0.98, GFR >60, sodium 143, potassium 4.4, A1c 6.4% Total cholesterol 193, triglycerides 121, HDL 44, LDL 127

## 2021-06-14 DIAGNOSIS — G4733 Obstructive sleep apnea (adult) (pediatric): Secondary | ICD-10-CM | POA: Diagnosis not present

## 2021-06-20 DIAGNOSIS — G4733 Obstructive sleep apnea (adult) (pediatric): Secondary | ICD-10-CM | POA: Diagnosis not present

## 2021-06-21 ENCOUNTER — Ambulatory Visit: Payer: BC Managed Care – PPO

## 2021-06-21 ENCOUNTER — Other Ambulatory Visit: Payer: Self-pay

## 2021-06-21 DIAGNOSIS — I517 Cardiomegaly: Secondary | ICD-10-CM | POA: Diagnosis not present

## 2021-06-22 NOTE — Progress Notes (Signed)
Called pt no answer, left a vm to return the call.

## 2021-06-22 NOTE — Progress Notes (Signed)
Pt aware.

## 2021-07-15 DIAGNOSIS — G4733 Obstructive sleep apnea (adult) (pediatric): Secondary | ICD-10-CM | POA: Diagnosis not present

## 2021-08-14 DIAGNOSIS — G4733 Obstructive sleep apnea (adult) (pediatric): Secondary | ICD-10-CM | POA: Diagnosis not present

## 2021-08-24 NOTE — Progress Notes (Signed)
Primary Physician/Referring:  [name], MD  [name], female    DOB: 09-26-1960, 61 y.o.   MRN: [id number]  Chief Complaint  Patient presents with   Results    3 MONTHS   HPI:    Crystal Green  is a 61 y.o. Caucasian female with history of hypertension, stage II CKD, prediabetes, obesity, sleep apnea (on CPAP).  Patient was last seen in our office 01/17/2018 for near cardiogenic syncope, at that time she was started on atenolol and symptoms significantly improved.  She does continue to have her episodes of palpitations and near syncope, however these have been well controlled over the last few years.  Patient reestablished care with our office 05/23/2021 with complaints of fatigue and uncontrolled blood pressure.  Patient presents for 56-month follow-up of palpitations and hypertension.  Last office visit switch patient from atenolol to verapamil 120 mg daily and advised her to monitor blood pressure at home and bring a written log to her next appointment.  Also ordered repeat echocardiogram Which revealed normal LVEF at 66% with mild valvular disease.  Patient reports since switching from atenolol to verapamil frequent loose stools have significantly improved.  She does however note more frequent episodes of palpitations and her blood pressure is mildly elevated above goal.  Denies chest pain, dyspnea, syncope, near syncope.  In the past she has been unable to tolerate metoprolol or propranolol due to fatigue.  Past Medical History:  Diagnosis Date   Chronic kidney disease    stage 3   Hypertension    Past Surgical History:  Procedure Laterality Date   ABDOMINAL HYSTERECTOMY     CATARACT EXTRACTION, BILATERAL  2003   KNEE SURGERY Right 2012   arthroscopic   LAPAROSCOPIC APPENDECTOMY  12/02/2011   Procedure: APPENDECTOMY LAPAROSCOPIC;  Surgeon: Rolm Bookbinder, MD;  Location: [facility] OR;  Service: General;  Laterality: N/A;   TONSILLECTOMY     Family History   Problem Relation Age of Onset   Hypertension Mother    Dementia Mother    Hypertension Father    Deep vein thrombosis Brother     Social History   Tobacco Use   Smoking status: Never   Smokeless tobacco: Never  Substance Use Topics   Alcohol use: Yes    Comment: rarely   Marital Status: Married   ROS  Review of Systems  Constitutional: Negative for malaise/fatigue and weight gain.  Cardiovascular:  Positive for near-syncope (rarely, well controlled) and palpitations (more frequent). Negative for chest pain, claudication, leg swelling, orthopnea, paroxysmal nocturnal dyspnea and syncope.  Respiratory:  Negative for shortness of breath.   Neurological:  Negative for dizziness.   Objective  Blood pressure 140/85, pulse 78, temperature 98 F (36.7 C), temperature source Temporal, resp. rate 17, height 5\' 6"  (1.676 m), weight 249 lb 3.2 oz (113 kg), SpO2 97 %.  Vitals with BMI 08/25/2021 05/23/2021 05/23/2021  Height 5\' 6"  - 5\' 6"   Weight 249 lbs 3 oz - 250 lbs  BMI [id number]  Systolic [id number]  Diastolic 85 74 74  Pulse 78 62 72    Physical Exam Vitals reviewed.  Constitutional:      Appearance: She is obese.  Cardiovascular:     Rate and Rhythm: Normal rate and regular rhythm.     Pulses: Intact distal pulses.     Heart sounds: S1 normal and S2 normal. No murmur heard.   No gallop.  Pulmonary:  Effort: Pulmonary effort is normal. No respiratory distress.     Breath sounds: No wheezing, rhonchi or rales.  Musculoskeletal:     Right lower leg: No edema.     Left lower leg: No edema.  Neurological:     Mental Status: She is alert.    Laboratory examination:   No results for input(s): NA, K, CL, CO2, GLUCOSE, BUN, CREATININE, CALCIUM, GFRNONAA, GFRAA in the last 8760 hours. CrCl cannot be calculated (Patient's most recent lab result is older than the maximum 21 days allowed.).  CMP Latest Ref Rng & Units 12/01/2011  Glucose 70 - 99 mg/dL 122(H)  BUN 6 - 23  mg/dL 15  Creatinine 0.50 - 1.10 mg/dL 0.79  Sodium 135 - 145 mEq/L 140  Potassium 3.5 - 5.1 mEq/L 3.9  Chloride 96 - 112 mEq/L 104  CO2 19 - 32 mEq/L 23  Calcium 8.4 - 10.5 mg/dL 9.2  Total Protein 6.0 - 8.3 g/dL 7.4  Total Bilirubin 0.3 - 1.2 mg/dL 1.0  Alkaline Phos 39 - 117 U/L 114  AST 0 - 37 U/L 20  ALT 0 - 35 U/L 15   CBC Latest Ref Rng & Units 12/01/2011 08/08/2010 06/29/2010  WBC 4.0 - 10.5 K/uL 13.4(H) 6.1 -  Hemoglobin 12.0 - 15.0 g/dL 12.8 11.7(L) 12.4  Hematocrit 36.0 - 46.0 % 38.3 35.0(L) -  Platelets 150 - 400 K/uL 208 249 -    Lipid Panel No results for input(s): CHOL, TRIG, LDLCALC, VLDL, HDL, CHOLHDL, LDLDIRECT in the last 8760 hours.  HEMOGLOBIN A1C No results found for: HGBA1C, MPG TSH No results for input(s): TSH in the last 8760 hours.  External labs:  07/19/2020: TSH 5.06 Hgb 5.0, HCT 37.3, platelet 220, MCV 90.1 BUN 12, creatinine 0.98, GFR >60, sodium 143, potassium 4.4, A1c 6.4% Total cholesterol 193, triglycerides 121, HDL 44, LDL 127  Allergies   Allergies  Allergen Reactions   Sudafed [Pseudoephedrine] Other (See Comments)    jittery   Amoxicillin Rash   Codeine Other (See Comments)    jittery   Penicillins Rash    Medications Prior to Visit:   Outpatient Medications Prior to Visit  Medication Sig Dispense Refill   cholestyramine (QUESTRAN) 4 GM/DOSE powder As needed  12   atorvastatin (LIPITOR) 40 MG tablet Take 1 tablet (40 mg total) by mouth daily. 30 tablet 11   verapamil (CALAN-SR) 120 MG CR tablet Take 1 tablet (120 mg total) by mouth at bedtime. 30 tablet 3   No facility-administered medications prior to visit.   Final Medications at End of Visit    Current Meds  Medication Sig   cholestyramine (QUESTRAN) 4 GM/DOSE powder As needed   [DISCONTINUED] atorvastatin (LIPITOR) 40 MG tablet Take 1 tablet (40 mg total) by mouth daily.   [DISCONTINUED] verapamil (CALAN-SR) 120 MG CR tablet Take 1 tablet (120 mg total) by mouth at  bedtime.   Radiology:   No results found.  Cardiac Studies:   Bilateral carotid artery 11/01/2017: No significant stenosis bilaterally.  PCV ECHOCARDIOGRAM COMPLETE 06/21/2021 Left ventricle cavity is normal in size. Mild concentric hypertrophy of the left ventricle. Normal global wall motion. Normal LV systolic function with EF 66%. Normal diastolic filling pattern. Mild (Grade I) mitral regurgitation. Mild tricuspid regurgitation. No evidence of pulmonary hypertension. Mild MR, TR new since previous study in 2013.   EKG:   05/23/2021: Sinus rhythm at a rate of 61 bpm.  Normal axis.  Nonspecific ST-T wave abnormalities, cannot exclude lateral ischemia.  Compared to 11/26/2017, no significant change.  Assessment     ICD-10-CM   1. Palpitations  R00.2     2. Essential hypertension  I10        Medications Discontinued During This Encounter  Medication Reason   verapamil (CALAN-SR) 120 MG CR tablet    atorvastatin (LIPITOR) 40 MG tablet Reorder    Meds ordered this encounter  Medications   verapamil (CALAN-SR) 180 MG CR tablet    Sig: Take 1 tablet (180 mg total) by mouth at bedtime.    Dispense:  90 tablet    Refill:  3   atorvastatin (LIPITOR) 40 MG tablet    Sig: Take 1 tablet (40 mg total) by mouth daily.    Dispense:  90 tablet    Refill:  3   Recommendations:   JIMYA BRASSELL is a 61 y.o. Caucasian female with history of hypertension, stage II CKD, prediabetes, obesity, sleep apnea (on CPAP).  Patient was last seen in our office 01/17/2018 for near cardiogenic syncope, at that time she was started on atenolol and symptoms significantly improved.  She does continue to have her episodes of palpitations and near syncope, however these have been well controlled over the last few years. Patient reestablished care with our office 05/23/2021 with complaints of fatigue and uncontrolled blood pressure.  Patient presents for 39-month follow-up of palpitations and hypertension.   Last office visit switch patient from atenolol to verapamil 120 mg daily and advised her to monitor blood pressure at home and bring a written log to her next appointment.  Also ordered repeat echocardiogram Which revealed normal LVEF at 66% with mild valvular disease.  Patient is tolerating verapamil without issue.  However she is having more frequent episodes of palpitations and blood pressure is mildly elevated above goal.  We will therefore increase verapamil from 120mg  to 180 mg daily.  Reviewed and discussed results of echocardiogram with patient, details above.  Patient's questions were addressed to her satisfaction.  Notably patient has had no recurrence of syncope since 2019  Patient Will come for nurse visit blood pressure check in 2 weeks.  We will otherwise have office visit follow-up in 6 months.   Alethia Berthold, PA-C 08/25/2021, 12:31 PM Office: [phone]

## 2021-08-25 ENCOUNTER — Encounter: Payer: Self-pay | Admitting: Student

## 2021-08-25 ENCOUNTER — Ambulatory Visit: Payer: BC Managed Care – PPO | Admitting: Student

## 2021-08-25 ENCOUNTER — Other Ambulatory Visit: Payer: Self-pay

## 2021-08-25 VITALS — BP 140/85 | HR 78 | Temp 98.0°F | Resp 17 | Ht 66.0 in | Wt 249.2 lb

## 2021-08-25 DIAGNOSIS — I10 Essential (primary) hypertension: Secondary | ICD-10-CM

## 2021-08-25 DIAGNOSIS — R002 Palpitations: Secondary | ICD-10-CM | POA: Diagnosis not present

## 2021-08-25 MED ORDER — ATORVASTATIN CALCIUM 40 MG PO TABS: 40.0000 mg | ORAL_TABLET | Freq: Every day | ORAL | 3 refills | Status: DC

## 2021-08-25 MED ORDER — VERAPAMIL HCL ER 180 MG PO TBCR: 180.0000 mg | EXTENDED_RELEASE_TABLET | Freq: Every day | ORAL | 3 refills | Status: DC

## 2021-09-07 ENCOUNTER — Encounter: Payer: Self-pay | Admitting: Student

## 2021-09-07 ENCOUNTER — Ambulatory Visit: Payer: BC Managed Care – PPO | Admitting: Student

## 2021-09-07 ENCOUNTER — Other Ambulatory Visit: Payer: Self-pay

## 2021-09-07 VITALS — BP 147/75 | HR 79 | Ht 66.0 in | Wt 246.0 lb

## 2021-09-07 VITALS — BP 147/75 | HR 79 | Ht 66.0 in | Wt 249.0 lb

## 2021-09-07 DIAGNOSIS — R002 Palpitations: Secondary | ICD-10-CM

## 2021-09-07 DIAGNOSIS — I10 Essential (primary) hypertension: Secondary | ICD-10-CM

## 2021-09-07 DIAGNOSIS — Z91199 Patient's noncompliance with other medical treatment and regimen due to unspecified reason: Secondary | ICD-10-CM

## 2021-09-07 MED ORDER — LOSARTAN POTASSIUM 25 MG PO TABS: 25.0000 mg | ORAL_TABLET | Freq: Every day | ORAL | 3 refills | Status: DC

## 2021-09-07 MED ORDER — VERAPAMIL HCL ER 120 MG PO TBCR: 120.0000 mg | EXTENDED_RELEASE_TABLET | Freq: Every day | ORAL | 3 refills | Status: DC

## 2021-09-07 NOTE — Progress Notes (Signed)
Duplicate encounter, please refer to other office visit for notes and recommendations.

## 2021-09-07 NOTE — Progress Notes (Signed)
Primary Physician/Referring:  [name], MD  [name], female    DOB: 03-Apr-1961, 61 y.o.   MRN: [id number]  Chief Complaint  Patient presents with   Blood Pressure Check   HPI:    Crystal Green  is a 61 y.o. Caucasian female with history of hypertension, stage II CKD, prediabetes, obesity, sleep apnea (on CPAP).  Patient was seen in 2019 for cardiogenic syncope, at that time she was started on atenolol and symptoms significantly improved. However given patient concern of fatigue, difficulty losing weight, and shared decision was to discontinue atenolol.  Switch patient from atenolol to verapamil 120 mg daily.  She noticed significant improvement of energy level and diarrhea resolved.  However she continued to have episodes of palpitations, therefore increase verapamil from 120 mg to 180 mg.  Patient presented for blood pressure check today, her blood pressure remained elevated and she had complaints of nausea and fatigue since increasing verapamil to 180 mg.  Patient was therefore added for full office visit.  Patient reports symptoms of palpitations were manageable on verapamil 120 mg daily. Denies chest pain, dyspnea, syncope, near syncope.  In the past she has been unable to tolerate metoprolol or propranolol due to fatigue.  Past Medical History:  Diagnosis Date   Chronic kidney disease    stage 3   Hypertension    Past Surgical History:  Procedure Laterality Date   ABDOMINAL HYSTERECTOMY     CATARACT EXTRACTION, BILATERAL  2003   KNEE SURGERY Right 2012   arthroscopic   LAPAROSCOPIC APPENDECTOMY  12/02/2011   Procedure: APPENDECTOMY LAPAROSCOPIC;  Surgeon: Rolm Bookbinder, MD;  Location: [facility] OR;  Service: General;  Laterality: N/A;   TONSILLECTOMY     Family History  Problem Relation Age of Onset   Hypertension Mother    Dementia Mother    Hypertension Father    Deep vein thrombosis Brother     Social History   Tobacco Use   Smoking status:  Never   Smokeless tobacco: Never  Substance Use Topics   Alcohol use: Yes    Comment: rarely   Marital Status: Married   ROS  Review of Systems  Constitutional: Positive for malaise/fatigue. Negative for weight gain.  Cardiovascular:  Positive for near-syncope (rarely, well controlled) and palpitations. Negative for chest pain, claudication, leg swelling, orthopnea, paroxysmal nocturnal dyspnea and syncope.  Respiratory:  Negative for shortness of breath.   Gastrointestinal:  Positive for nausea.  Neurological:  Negative for dizziness.   Objective  Blood pressure (!) 147/75, pulse 79, height 5\' 6"  (1.676 m), weight 249 lb (112.9 kg), SpO2 97 %.  Vitals with BMI 09/07/2021 09/07/2021 09/07/2021  Height - 5\' 6"  5\' 6"   Weight - 246 lbs 249 lbs  BMI - 99991111 [id number]  Systolic [id number] [id number]  Diastolic 75 66 75  Pulse 79 78 79    Physical Exam Vitals reviewed.  Constitutional:      Appearance: She is obese.  Cardiovascular:     Rate and Rhythm: Normal rate and regular rhythm.     Pulses: Intact distal pulses.     Heart sounds: S1 normal and S2 normal. No murmur heard.   No gallop.  Pulmonary:     Effort: Pulmonary effort is normal. No respiratory distress.     Breath sounds: No wheezing, rhonchi or rales.  Musculoskeletal:     Right lower leg: No edema.     Left lower leg: No edema.  Neurological:  Mental Status: She is alert.  Physical exam unchanged compared to previous office visit.  Laboratory examination:   No results for input(s): NA, K, CL, CO2, GLUCOSE, BUN, CREATININE, CALCIUM, GFRNONAA, GFRAA in the last 8760 hours. CrCl cannot be calculated (Patient's most recent lab result is older than the maximum 21 days allowed.).  CMP Latest Ref Rng & Units 12/01/2011  Glucose 70 - 99 mg/dL 122(H)  BUN 6 - 23 mg/dL 15  Creatinine 0.50 - 1.10 mg/dL 0.79  Sodium 135 - 145 mEq/L 140  Potassium 3.5 - 5.1 mEq/L 3.9  Chloride 96 - 112 mEq/L 104  CO2 19 - 32 mEq/L 23  Calcium 8.4  - 10.5 mg/dL 9.2  Total Protein 6.0 - 8.3 g/dL 7.4  Total Bilirubin 0.3 - 1.2 mg/dL 1.0  Alkaline Phos 39 - 117 U/L 114  AST 0 - 37 U/L 20  ALT 0 - 35 U/L 15   CBC Latest Ref Rng & Units 12/01/2011 08/08/2010 06/29/2010  WBC 4.0 - 10.5 K/uL 13.4(H) 6.1 -  Hemoglobin 12.0 - 15.0 g/dL 12.8 11.7(L) 12.4  Hematocrit 36.0 - 46.0 % 38.3 35.0(L) -  Platelets 150 - 400 K/uL 208 249 -    Lipid Panel No results for input(s): CHOL, TRIG, LDLCALC, VLDL, HDL, CHOLHDL, LDLDIRECT in the last 8760 hours.  HEMOGLOBIN A1C No results found for: HGBA1C, MPG TSH No results for input(s): TSH in the last 8760 hours.  External labs:  07/19/2020: TSH 5.06 Hgb 5.0, HCT 37.3, platelet 220, MCV 90.1 BUN 12, creatinine 0.98, GFR >60, sodium 143, potassium 4.4, A1c 6.4% Total cholesterol 193, triglycerides 121, HDL 44, LDL 127  Allergies   Allergies  Allergen Reactions   Sudafed [Pseudoephedrine] Other (See Comments)    jittery   Amoxicillin Rash   Codeine Other (See Comments)    jittery   Penicillins Rash    Medications Prior to Visit:   Outpatient Medications Prior to Visit  Medication Sig Dispense Refill   atorvastatin (LIPITOR) 40 MG tablet Take 1 tablet (40 mg total) by mouth daily. 90 tablet 3   cholestyramine (QUESTRAN) 4 GM/DOSE powder As needed  12   verapamil (CALAN-SR) 180 MG CR tablet Take 1 tablet (180 mg total) by mouth at bedtime. 90 tablet 3   No facility-administered medications prior to visit.   Final Medications at End of Visit    Current Meds  Medication Sig   atorvastatin (LIPITOR) 40 MG tablet Take 1 tablet (40 mg total) by mouth daily.   cholestyramine (QUESTRAN) 4 GM/DOSE powder As needed   losartan (COZAAR) 25 MG tablet Take 1 tablet (25 mg total) by mouth daily.   [DISCONTINUED] verapamil (CALAN-SR) 180 MG CR tablet Take 1 tablet (180 mg total) by mouth at bedtime.   Radiology:   No results found.  Cardiac Studies:   Bilateral carotid artery 11/01/2017: No  significant stenosis bilaterally.  PCV ECHOCARDIOGRAM COMPLETE 06/21/2021 Left ventricle cavity is normal in size. Mild concentric hypertrophy of the left ventricle. Normal global wall motion. Normal LV systolic function with EF 66%. Normal diastolic filling pattern. Mild (Grade I) mitral regurgitation. Mild tricuspid regurgitation. No evidence of pulmonary hypertension. Mild MR, TR new since previous study in 2013.   EKG:   05/23/2021: Sinus rhythm at a rate of 61 bpm.  Normal axis.  Nonspecific ST-T wave abnormalities, cannot exclude lateral ischemia. Compared to 11/26/2017, no significant change.  Assessment     ICD-10-CM   1. Essential hypertension  99991111 Basic metabolic panel  2. Palpitations  R00.2        Medications Discontinued During This Encounter  Medication Reason   verapamil (CALAN-SR) 180 MG CR tablet     Meds ordered this encounter  Medications   verapamil (CALAN-SR) 120 MG CR tablet    Sig: Take 1 tablet (120 mg total) by mouth at bedtime.    Dispense:  90 tablet    Refill:  3   losartan (COZAAR) 25 MG tablet    Sig: Take 1 tablet (25 mg total) by mouth daily.    Dispense:  30 tablet    Refill:  3   Recommendations:   Crystal Green is a 61 y.o. Caucasian female with history of hypertension, stage II CKD, prediabetes, obesity, sleep apnea (on CPAP).  Patient was seen in 2019 for cardiogenic syncope, at that time she was started on atenolol and symptoms significantly improved. However given patient concern of fatigue, difficulty losing weight, and shared decision was to discontinue atenolol.  Switch patient from atenolol to verapamil 120 mg daily.  She noticed significant improvement of energy level and diarrhea resolved.  However she continued to have episodes of palpitations, therefore increase verapamil from 120 mg to 180 mg.  Patient presented for blood pressure check today, her blood pressure remained elevated and she had complaints of nausea and fatigue since  increasing verapamil to 180 mg.  Patient was therefore added for full office visit.  Shared decision was to reduce verapamil to 120 mg daily as she tolerated this and symptoms of palpitations were manageable according to patient.  Patient's blood pressure remains elevated above goal, will therefore start losartan 25 mg p.o. daily with repeat BMP in 1 week.  Notably patient has had no recurrence of syncope since 2019.   Follow up in 6 months, sooner if needed, for palpitations, hypertension, history of syncope.   Alethia Berthold, PA-C 09/07/2021, 1:19 PM Office: [phone]

## 2021-09-19 ENCOUNTER — Ambulatory Visit: Payer: BC Managed Care – PPO | Admitting: Adult Health

## 2021-09-19 ENCOUNTER — Encounter: Payer: Self-pay | Admitting: Adult Health

## 2021-09-19 VITALS — BP 140/86 | HR 67 | Ht 66.0 in | Wt 250.5 lb

## 2021-09-19 DIAGNOSIS — G4733 Obstructive sleep apnea (adult) (pediatric): Secondary | ICD-10-CM | POA: Diagnosis not present

## 2021-09-19 DIAGNOSIS — Z9989 Dependence on other enabling machines and devices: Secondary | ICD-10-CM

## 2021-09-19 NOTE — Patient Instructions (Signed)
Your Plan:  Continue using cpap nightly  If your symptoms worsen or you develop new symptoms please let us know.   Thank you for coming to see us at [facility]. I hope we have been able to provide you high quality care today.  You may receive a patient satisfaction survey over the next few weeks. We would appreciate your feedback and comments so that we may continue to improve ourselves and the health of our patients.  

## 2021-09-19 NOTE — Progress Notes (Signed)
PATIENT: Crystal Green DOB: 1961-07-20  REASON FOR VISIT: follow up HISTORY FROM: patient  HISTORY OF PRESENT ILLNESS: Today 09/19/21:  Crystal Green is a 61 year old female with a history of obstructive sleep apnea on CPAP.  She returns today for follow-up.  She denies any new issues with the CPAP.  Reports occasionally she will take it off in the middle of the night unknowingly.  Her download is below   09/16/20: Crystal Green is a 61 year old female with a history of obstructive sleep apnea on CPAP.  She reports that she is having a hard time tolerating her mask.  She states that it becomes worse after 2 weeks of using it.  She states that she wakes up several times at night having to adjust the mask to keep it from leaking.  She would like to try a different style of mask.  Compliance Report Usage 08/17/2020 - 09/15/2020 Usage days 29/30 days (97%) >= 4 hours 28 days (93%) Average usage (days used) 7 hours 23 minutes  AirSense 10 AutoSet Serial number [id number] Mode AutoSet Min Pressure 5 cmH2O Max Pressure 18 cmH2O EPR Fulltime EPR level 3  Therapy Pressure - cmH2O Median: 9.6 95th percentile: 12.0 Maximum: 13.5 Leaks - L/min Median: 0.0 95th percentile: 7.4 Maximum: 38.2 Events per hour AI: 2.2 HI: 0.8 AHI: 3.0 Apnea Index Central: 0.4 Obstructive: 1.7 Unknown: 0.0  HISTORY 09-17-2019, regular compliance visit.  Her last visit with [place] was on 20 November after she had started using an auto titration CPAP machine.  She had undergone a sleep study on March 13, 2018 which had confirmed apnea, she had also a near syncope spell at the time.   The patient has been working from home, she has used the CPAP compliantly 87% 26 out of 30 days with an average of 5 hours and 47 minutes nocturnal use.  The AutoSet is set between a minimum pressure of 5 and a maximum pressure of 18 cmH2O this recent meter EPR.  AHI is 2.8/h which is a considerable reduction in apnea the vast majority of residual  apneas are still obstructive in nature the 95th percentile pressure is 13 cmH2O which surprises me.  She has minor air leaks and she has tried several masks with [employer] in the meantime and finally seems to have found something that works reasonably well.     REVIEW OF SYSTEMS: Out of a complete 14 system review of symptoms, the patient complains only of the following symptoms, and all other reviewed systems are negative.   ESS 3  ALLERGIES: Allergies  Allergen Reactions   Sudafed [Pseudoephedrine] Other (See Comments)    jittery   Amoxicillin Rash   Codeine Other (See Comments)    jittery   Penicillins Rash    HOME MEDICATIONS: Outpatient Medications Prior to Visit  Medication Sig Dispense Refill   atorvastatin (LIPITOR) 40 MG tablet Take 1 tablet (40 mg total) by mouth daily. 90 tablet 3   cholestyramine (QUESTRAN) 4 GM/DOSE powder As needed  12   losartan (COZAAR) 25 MG tablet Take 1 tablet (25 mg total) by mouth daily. 30 tablet 3   verapamil (CALAN-SR) 120 MG CR tablet Take 1 tablet (120 mg total) by mouth at bedtime. 90 tablet 3   No facility-administered medications prior to visit.    PAST MEDICAL HISTORY: Past Medical History:  Diagnosis Date   Chronic kidney disease    stage 3   Hypertension     PAST SURGICAL HISTORY: Past  Surgical History:  Procedure Laterality Date   ABDOMINAL HYSTERECTOMY     CATARACT EXTRACTION, BILATERAL  2003   KNEE SURGERY Right 2012   arthroscopic   LAPAROSCOPIC APPENDECTOMY  12/02/2011   Procedure: APPENDECTOMY LAPAROSCOPIC;  Surgeon: Rolm Bookbinder, MD;  Location: [facility] OR;  Service: General;  Laterality: N/A;   TONSILLECTOMY      FAMILY HISTORY: Family History  Problem Relation Age of Onset   Hypertension Mother    Dementia Mother    Hypertension Father    Deep vein thrombosis Brother    Sleep apnea Neg Hx     SOCIAL HISTORY: Social History   Socioeconomic History   Marital status: Married    Spouse name: Not on file    Number of children: 3   Years of education: Not on file   Highest education level: Not on file  Occupational History   Not on file  Tobacco Use   Smoking status: Never   Smokeless tobacco: Never  Vaping Use   Vaping Use: Never used  Substance and Sexual Activity   Alcohol use: Yes    Comment: rarely   Drug use: Not Currently   Sexual activity: Not on file  Other Topics Concern   Not on file  Social History Narrative   Not on file   Social Determinants of Health   Financial Resource Strain: Not on file  Food Insecurity: Not on file  Transportation Needs: Not on file  Physical Activity: Not on file  Stress: Not on file  Social Connections: Not on file  Intimate Partner Violence: Not on file      PHYSICAL EXAM  Vitals:   09/19/21 1101  BP: 140/86  Pulse: 67  Weight: 250 lb 8 oz (113.6 kg)  Height: 5\' 6"  (1.676 m)   Body mass index is 40.43 kg/m.  Generalized: Well developed, in no acute distress  Chest: Lungs clear to auscultation bilaterally  Neurological examination  Mentation: Alert oriented to time, place, history taking. Follows all commands speech and language fluent Cranial nerve II-XII: Extraocular movements were full, visual field were full on confrontational test Head turning and shoulder shrug  were normal and symmetric. Motor: The motor testing reveals 5 over 5 strength of all 4 extremities. Good symmetric motor tone is noted throughout.  Sensory: Sensory testing is intact to soft touch on all 4 extremities. No evidence of extinction is noted.  Gait and station: Gait is normal.    DIAGNOSTIC DATA (LABS, IMAGING, TESTING) - I reviewed patient records, labs, notes, testing and imaging myself where available.  Lab Results  Component Value Date   WBC 13.4 (H) 12/01/2011   HGB 12.8 12/01/2011   HCT 38.3 12/01/2011   MCV 89.5 12/01/2011   PLT 208 12/01/2011      Component Value Date/Time   NA 140 12/01/2011 1514   K 3.9 12/01/2011 1514   CL  104 12/01/2011 1514   CO2 23 12/01/2011 1514   GLUCOSE 122 (H) 12/01/2011 1514   BUN 15 12/01/2011 1514   CREATININE 0.79 12/01/2011 1514   CALCIUM 9.2 12/01/2011 1514   PROT 7.4 12/01/2011 1514   ALBUMIN 4.1 12/01/2011 1514   AST 20 12/01/2011 1514   ALT 15 12/01/2011 1514   ALKPHOS 114 12/01/2011 1514   BILITOT 1.0 12/01/2011 1514   GFRNONAA >90 12/01/2011 1514   GFRAA >90 12/01/2011 1514      ASSESSMENT AND PLAN 61 y.o. year old female  has a past medical history of Chronic  kidney disease and Hypertension. here with:  OSA on CPAP  - CPAP compliance excellent - Good treatment of AHI  -Order sent for mask refitting - Encourage patient to use CPAP nightly and > 4 hours each night - F/U in 1 year or sooner if needed    Ward Givens, MSN, NP-C 09/19/2021, 11:06 AM [facility] [address] [place], [place] [address] [phone]

## 2021-10-04 DIAGNOSIS — I10 Essential (primary) hypertension: Secondary | ICD-10-CM | POA: Diagnosis not present

## 2021-10-05 LAB — BASIC METABOLIC PANEL
BUN/Creatinine Ratio: 16 (ref 9–23)
BUN: 14 mg/dL (ref 6–24)
CO2: 20 mmol/L (ref 20–29)
Calcium: 9 mg/dL (ref 8.7–10.2)
Chloride: 106 mmol/L (ref 96–106)
Creatinine, Ser: 0.9 mg/dL (ref 0.57–1.00)
Glucose: 164 mg/dL — ABNORMAL HIGH (ref 70–99)
Potassium: 4.1 mmol/L (ref 3.5–5.2)
Sodium: 142 mmol/L (ref 134–144)
eGFR: 74 mL/min/{1.73_m2} (ref >59)

## 2021-10-05 NOTE — Progress Notes (Signed)
Called and spoke to pt regarding lab results, pt voiced understanding.

## 2021-11-11 ENCOUNTER — Other Ambulatory Visit: Payer: Self-pay

## 2021-11-11 MED ORDER — LOSARTAN POTASSIUM 25 MG PO TABS: 25.0000 mg | ORAL_TABLET | Freq: Every day | ORAL | 3 refills | Status: DC

## 2021-11-11 MED ORDER — ATORVASTATIN CALCIUM 40 MG PO TABS: 40.0000 mg | ORAL_TABLET | Freq: Every day | ORAL | 3 refills | Status: DC

## 2021-11-11 MED ORDER — VERAPAMIL HCL ER 120 MG PO TBCR: 120.0000 mg | EXTENDED_RELEASE_TABLET | Freq: Every day | ORAL | 3 refills | Status: DC

## 2022-01-22 ENCOUNTER — Other Ambulatory Visit: Payer: Self-pay | Admitting: Student

## 2022-02-23 ENCOUNTER — Ambulatory Visit: Payer: BC Managed Care – PPO | Admitting: Student

## 2022-03-07 ENCOUNTER — Encounter: Payer: Self-pay | Admitting: Student

## 2022-03-07 ENCOUNTER — Ambulatory Visit: Payer: BC Managed Care – PPO | Admitting: Student

## 2022-03-07 VITALS — BP 137/84 | HR 78 | Temp 97.6°F | Resp 17 | Ht 66.0 in | Wt 255.8 lb

## 2022-03-07 DIAGNOSIS — I10 Essential (primary) hypertension: Secondary | ICD-10-CM

## 2022-03-07 DIAGNOSIS — R002 Palpitations: Secondary | ICD-10-CM | POA: Diagnosis not present

## 2022-03-07 NOTE — Progress Notes (Signed)
Primary Physician/Referring:  Georgianne Fick, MD  [name], female    DOB: 1961/03/07, 61 y.o.   MRN: [id number]  Chief Complaint  Patient presents with   Hypertension   Palpitations    6 MONTH   HPI:    Crystal Green  is a [age] y.o. Caucasian female with history of hypertension, stage II CKD, prediabetes, obesity, sleep apnea (on CPAP).  Patient was seen in 2019 for cardiogenic syncope, at that time she was started on atenolol and symptoms significantly improved. However given patient concern of fatigue, difficulty losing weight, and shared decision was to discontinue atenolol.  Patient is now tolerating verapamil and losartan without issue.  Patient presents for follow-up of hypertension and palpitations.  Patient reports symptoms have improved since last office visit.  Although she did have COVID infection in June and has had lingering cough and shortness of breath since then.  Patient will follow-up with PCP for further evaluation and management of post-COVID symptoms.  Blood pressure is now well controlled.  Past Medical History:  Diagnosis Date   Chronic kidney disease    stage 3   Hypertension    Past Surgical History:  Procedure Laterality Date   ABDOMINAL HYSTERECTOMY     CATARACT EXTRACTION, BILATERAL  2003   KNEE SURGERY Right 2012   arthroscopic   LAPAROSCOPIC APPENDECTOMY  12/02/2011   Procedure: APPENDECTOMY LAPAROSCOPIC;  Surgeon: Emelia Loron, MD;  Location: [facility] OR;  Service: General;  Laterality: N/A;   TONSILLECTOMY     Family History  Problem Relation Age of Onset   Hypertension Mother    Dementia Mother    Hypertension Father    Deep vein thrombosis Brother    Sleep apnea Neg Hx     Social History   Tobacco Use   Smoking status: Never   Smokeless tobacco: Never  Substance Use Topics   Alcohol use: Yes    Comment: rarely   Marital Status: Married   ROS  Review of Systems  Constitutional: Negative for weight gain.   Cardiovascular:  Positive for near-syncope (rarely, well controlled) and palpitations (improved). Negative for chest pain, claudication, leg swelling, orthopnea, paroxysmal nocturnal dyspnea and syncope.  Respiratory:  Positive for cough (since covid).     Objective  Blood pressure 137/84, pulse 78, temperature 97.6 F (36.4 C), temperature source Temporal, resp. rate 17, height 5\' 6"  (1.676 m), weight 255 lb 12.8 oz (116 kg), SpO2 98 %.     03/07/2022   10:59 AM 09/19/2021   11:01 AM 09/07/2021   12:37 PM  Vitals with BMI  Height 5\' 6"  5\' 6"    Weight 255 lbs 13 oz 250 lbs 8 oz   BMI 41.31 40.45   Systolic 137 140 09/09/2021  Diastolic 84 86 75  Pulse 78 67 79    Physical Exam Vitals reviewed.  Constitutional:      Appearance: She is obese.  Cardiovascular:     Rate and Rhythm: Normal rate and regular rhythm.     Pulses: Intact distal pulses.     Heart sounds: S1 normal and S2 normal. No murmur heard.    No gallop.  Pulmonary:     Effort: Pulmonary effort is normal. No respiratory distress.     Breath sounds: No wheezing, rhonchi or rales.  Musculoskeletal:     Right lower leg: No edema.     Left lower leg: No edema.  Neurological:     Mental Status: She is alert.  Physical exam unchanged compared to previous office visit.  Laboratory examination:   Recent Labs    10/04/21 0952  NA 142  K 4.1  CL 106  CO2 20  GLUCOSE 164*  BUN 14  CREATININE 0.90  CALCIUM 9.0   CrCl cannot be calculated (Patient's most recent lab result is older than the maximum 21 days allowed.).     Latest Ref Rng & Units 10/04/2021    9:52 AM 12/01/2011    3:14 PM  CMP  Glucose 70 - 99 mg/dL 045  409   BUN 6 - 24 mg/dL 14  15   Creatinine 8.11 - 1.00 mg/dL 9.14  7.82   Sodium 956 - 144 mmol/L 142  140   Potassium 3.5 - 5.2 mmol/L 4.1  3.9   Chloride 96 - 106 mmol/L 106  104   CO2 20 - 29 mmol/L 20  23   Calcium 8.7 - 10.2 mg/dL 9.0  9.2   Total Protein 6.0 - 8.3 g/dL  7.4   Total  Bilirubin 0.3 - 1.2 mg/dL  1.0   Alkaline Phos 39 - 117 U/L  114   AST 0 - 37 U/L  20   ALT 0 - 35 U/L  15       Latest Ref Rng & Units 12/01/2011    3:14 PM 08/08/2010   11:45 AM 06/29/2010   11:55 AM  CBC  WBC 4.0 - 10.5 K/uL 13.4  6.1    Hemoglobin 12.0 - 15.0 g/dL 21.3  08.6  57.8   Hematocrit 36.0 - 46.0 % 38.3  35.0    Platelets 150 - 400 K/uL 208  249      Lipid Panel No results for input(s): "CHOL", "TRIG", "LDLCALC", "VLDL", "HDL", "CHOLHDL", "LDLDIRECT" in the last 8760 hours.  HEMOGLOBIN A1C No results found for: "HGBA1C", "MPG" TSH No results for input(s): "TSH" in the last 8760 hours.  External labs:  07/19/2020: TSH 5.06 Hgb 5.0, HCT 37.3, platelet 220, MCV 90.1 BUN 12, creatinine 0.98, GFR >60, sodium 143, potassium 4.4, A1c 6.4% Total cholesterol 193, triglycerides 121, HDL 44, LDL 127  Allergies   Allergies  Allergen Reactions   Sudafed [Pseudoephedrine] Other (See Comments)    jittery   Amoxicillin Rash   Codeine Other (See Comments)    jittery   Penicillins Rash    Medications Prior to Visit:   Outpatient Medications Prior to Visit  Medication Sig Dispense Refill   atorvastatin (LIPITOR) 40 MG tablet Take 1 tablet (40 mg total) by mouth daily. 90 tablet 3   cholestyramine (QUESTRAN) 4 GM/DOSE powder As needed  12   losartan (COZAAR) 25 MG tablet TAKE 1 TABLET BY MOUTH DAILY 90 tablet 3   verapamil (CALAN-SR) 120 MG CR tablet Take 1 tablet (120 mg total) by mouth at bedtime. 90 tablet 3   No facility-administered medications prior to visit.   Final Medications at End of Visit    Current Meds  Medication Sig   atorvastatin (LIPITOR) 40 MG tablet Take 1 tablet (40 mg total) by mouth daily.   cholestyramine (QUESTRAN) 4 GM/DOSE powder As needed   losartan (COZAAR) 25 MG tablet TAKE 1 TABLET BY MOUTH DAILY   verapamil (CALAN-SR) 120 MG CR tablet Take 1 tablet (120 mg total) by mouth at bedtime.   Radiology:   No results found.  Cardiac  Studies:   Bilateral carotid artery 11/01/2017: No significant stenosis bilaterally.  PCV ECHOCARDIOGRAM COMPLETE 06/21/2021 Left ventricle cavity is normal  in size. Mild concentric hypertrophy of the left ventricle. Normal global wall motion. Normal LV systolic function with EF 66%. Normal diastolic filling pattern. Mild (Grade I) mitral regurgitation. Mild tricuspid regurgitation. No evidence of pulmonary hypertension. Mild MR, TR new since previous study in 2013.   EKG:  03/07/2022: Sinus rhythm at a rate of 77 bpm.  Normal axis.  Nonspecific ST-T wave abnormalities, cannot exclude lateral ischemia.  Compared EKG 05/23/2021, no significant change.  Assessment     ICD-10-CM   1. Essential hypertension  I10 EKG 12-Lead    2. Palpitations  R00.2        There are no discontinued medications.   No orders of the defined types were placed in this encounter.  Recommendations:   Crystal Green is a 61 y.o. Caucasian female with history of hypertension, stage II CKD, prediabetes, obesity, sleep apnea (on CPAP).  Patient was seen in 2019 for cardiogenic syncope, at that time she was started on atenolol and symptoms significantly improved. However given patient concern of fatigue, difficulty losing weight, and shared decision was to discontinue atenolol.  Patient is now tolerating verapamil and losartan without issue.  Patient presents for follow-up of hypertension and palpitations.  Patient's symptoms have improved with verapamil and blood pressure is now controlled with losartan.  Will not make changes today.  Patient to continue to monitor for palpitations and near syncope, she will notify our office of issues.   Notably patient has had no recurrence of syncope since 2019.   Follow up in 6 months, sooner if needed.    Alethia Berthold, PA-C 03/07/2022, 1:06 PM Office: [phone]

## 2022-03-14 ENCOUNTER — Other Ambulatory Visit: Payer: Self-pay | Admitting: Internal Medicine

## 2022-03-14 DIAGNOSIS — Z1231 Encounter for screening mammogram for malignant neoplasm of breast: Secondary | ICD-10-CM

## 2022-03-17 ENCOUNTER — Ambulatory Visit
Admission: RE | Admit: 2022-03-17 | Discharge: 2022-03-17 | Disposition: A | Discharge status: Home / Self Care | Payer: BC Managed Care – PPO | Source: Ambulatory Visit | Attending: Internal Medicine | Admitting: Internal Medicine

## 2022-03-17 DIAGNOSIS — Z1231 Encounter for screening mammogram for malignant neoplasm of breast: Secondary | ICD-10-CM

## 2022-05-15 DIAGNOSIS — J302 Other seasonal allergic rhinitis: Secondary | ICD-10-CM | POA: Diagnosis not present

## 2022-05-15 DIAGNOSIS — R0989 Other specified symptoms and signs involving the circulatory and respiratory systems: Secondary | ICD-10-CM | POA: Diagnosis not present

## 2022-05-15 DIAGNOSIS — R062 Wheezing: Secondary | ICD-10-CM | POA: Diagnosis not present

## 2022-05-15 DIAGNOSIS — I10 Essential (primary) hypertension: Secondary | ICD-10-CM | POA: Diagnosis not present

## 2022-06-07 DIAGNOSIS — R059 Cough, unspecified: Secondary | ICD-10-CM | POA: Diagnosis not present

## 2022-06-07 DIAGNOSIS — J40 Bronchitis, not specified as acute or chronic: Secondary | ICD-10-CM | POA: Diagnosis not present

## 2022-06-07 DIAGNOSIS — R058 Other specified cough: Secondary | ICD-10-CM | POA: Diagnosis not present

## 2022-06-07 DIAGNOSIS — R0981 Nasal congestion: Secondary | ICD-10-CM | POA: Diagnosis not present

## 2022-06-29 ENCOUNTER — Institutional Professional Consult (permissible substitution): Payer: BC Managed Care – PPO | Admitting: Adult Health

## 2022-06-29 ENCOUNTER — Institutional Professional Consult (permissible substitution): Payer: BC Managed Care – PPO | Admitting: Pulmonary Disease

## 2022-07-03 ENCOUNTER — Institutional Professional Consult (permissible substitution): Payer: BC Managed Care – PPO | Admitting: Primary Care

## 2022-07-05 ENCOUNTER — Ambulatory Visit (INDEPENDENT_AMBULATORY_CARE_PROVIDER_SITE_OTHER): Payer: BC Managed Care – PPO

## 2022-07-05 ENCOUNTER — Encounter: Payer: Self-pay | Admitting: Internal Medicine

## 2022-07-05 ENCOUNTER — Ambulatory Visit: Payer: BC Managed Care – PPO | Admitting: Internal Medicine

## 2022-07-05 VITALS — BP 146/84 | HR 87 | Temp 98.3°F | Ht 66.0 in | Wt 253.0 lb

## 2022-07-05 DIAGNOSIS — Z23 Encounter for immunization: Secondary | ICD-10-CM

## 2022-07-05 DIAGNOSIS — R053 Chronic cough: Secondary | ICD-10-CM | POA: Diagnosis not present

## 2022-07-05 DIAGNOSIS — R918 Other nonspecific abnormal finding of lung field: Secondary | ICD-10-CM | POA: Diagnosis not present

## 2022-07-05 DIAGNOSIS — R059 Cough, unspecified: Secondary | ICD-10-CM | POA: Diagnosis not present

## 2022-07-05 DIAGNOSIS — G4733 Obstructive sleep apnea (adult) (pediatric): Secondary | ICD-10-CM | POA: Diagnosis not present

## 2022-07-05 LAB — INFLUENZA INJ QUAD PF 6+MOS

## 2022-07-05 LAB — POCT EXHALED NITRIC OXIDE: FeNO level (ppb): 12

## 2022-07-05 MED ORDER — METHYLPREDNISOLONE ACETATE 80 MG/ML IJ SUSP
120.0000 mg | Freq: Once | INTRAMUSCULAR | Status: AC
  Administered 2022-07-05: 120 mg via INTRAMUSCULAR

## 2022-07-05 MED ORDER — TRAMADOL HCL 50 MG PO TABS: 50.0000 mg | ORAL_TABLET | ORAL | 0 refills | Status: AC | PRN

## 2022-07-05 MED ORDER — PANTOPRAZOLE SODIUM 40 MG PO TBEC: 40.0000 mg | DELAYED_RELEASE_TABLET | Freq: Every day | ORAL | 2 refills | Status: DC

## 2022-07-05 NOTE — Patient Instructions (Signed)
The key to effective treatment for your cough is eliminating the non-stop cycle of cough you're stuck in long enough to let your airway heal completely and then see if there is anything still making you cough once you stop the cough suppression, but this should take no more than 5 days to figure out  First take delsym two tsp every 12 hours and supplement if needed with  tramadol 50 mg up to 1-2 every 4 hours to suppress the urge to cough at all or even clear your throat. Swallowing water or using ice chips/non mint and menthol containing candies (such as lifesavers or sugarless jolly ranchers) are also effective.  You should rest your voice and avoid activities that you know make you cough.  Once you have eliminated the cough for 3 straight days try reducing the tramadol first,  then the delsym as tolerated.    Depomedrol 120 mg IM   Protonix (pantoprazole) Take 30-60 min before first meal of the day and Pepcid 20 mg one bedtime plus chlorpheniramine 4 mg x 1- 2 at bedtime (both available over the counter)  until cough is completely gone for at least a week without the need for cough suppression  GERD (REFLUX)  is an extremely common cause of respiratory symptoms, many times with no significant heartburn at all.    It can be treated with medication, but also with lifestyle changes including avoidance of late meals, excessive alcohol, smoking cessation, and avoid fatty foods, chocolate, peppermint, colas, red wine, and acidic juices such as orange juice.  NO MINT OR MENTHOL PRODUCTS SO NO COUGH DROPS  USE HARD CANDY INSTEAD (jolley ranchers or Stover's or Lifesavers (all available in sugarless versions) NO OIL BASED VITAMINS - use powdered substitutes.   Please remember to go to the  x-ray department  for your tests - we will call you with the results when they are available    Please schedule a follow up office visit in 2 weeks, sooner if needed

## 2022-07-05 NOTE — Progress Notes (Signed)
Crystal Green, female    DOB: 02-01-1961   MRN: [id number]   Brief patient profile:  61  yowf never smoker from [place] with no problems @ baseline wt 180  referred to pulmonary clinic 07/05/2022 by Dr Jolaine Click  for cough since 1990s prior to moving to new construction house around 2000  allergy tested with pos for cats, trees "never bad enough for shots"  but no apparent direct correlation between drippy nose and cough or over HB.     History of Present Illness  07/05/2022  Pulmonary/ 1st office eval/Christie Copley  Chief Complaint  Patient presents with   Consult    Pt states she had Bronchitis  at the end of September and the cough will not go away.  Dyspnea:  Not limited by breathing from desired activities  / no aerobics  Cough: sporadic esp productive first thing in am  Sleep: on side mostly on side/ flat bed / cpap per [facility] SABA use: not much help Severe coughing sneezing fits > one week prior to OV  severed back pain p one episode positional in nature, midline   No obvious day to day or daytime pattern/variability or assoc excess/ purulent sputum or mucus plugs or hemoptysis or chest tightness, subjective wheeze or overt sinus or hb symptoms.     Also denies any obvious fluctuation of symptoms with weather or environmental changes or other aggravating or alleviating factors except as outlined above   No unusual exposure hx or h/o childhood pna/ asthma or knowledge of premature birth.  Current Allergies, Complete Past Medical History, Past Surgical History, Family History, and Social History were reviewed in [employer] record.  ROS  The following are not active complaints unless bolded Hoarseness, sore throat, dysphagia, dental problems, itching, sneezing,  nasal congestion or discharge of excess mucus or purulent secretions, ear ache,   fever, chills, sweats, unintended wt loss or wt gain, classically pleuritic or exertional cp,  orthopnea pnd or  arm/hand swelling  or leg swelling, presyncope, palpitations, abdominal pain, anorexia, nausea, vomiting, diarrhea  or change in bowel habits or change in bladder habits, change in stools or change in urine, dysuria, hematuria,  rash, arthralgias, visual complaints, headache, numbness, weakness or ataxia or problems with walking or coordination,  change in mood or  memory.              Past Medical History:  Diagnosis Date   Chronic kidney disease    stage 3   Hypertension     Outpatient Medications Prior to Visit  Medication Sig Dispense Refill   atorvastatin (LIPITOR) 40 MG tablet Take 1 tablet (40 mg total) by mouth daily. 90 tablet 3   cholestyramine (QUESTRAN) 4 GM/DOSE powder As needed  12   losartan (COZAAR) 25 MG tablet TAKE 1 TABLET BY MOUTH DAILY 90 tablet 3   verapamil (CALAN-SR) 120 MG CR tablet Take 1 tablet (120 mg total) by mouth at bedtime. 90 tablet 3   No facility-administered medications prior to visit.     Objective:     BP (!) 146/84 (BP Location: Left Arm, Patient Position: Sitting, Cuff Size: Large)   Pulse 87   Temp 98.3 F (36.8 C) (Oral)   Ht 5\' 6"  (1.676 m)   Wt 253 lb (114.8 kg)   SpO2 97% Comment: on RA  BMI 40.84 kg/m   SpO2: 97 % (on RA)  HEENT : Oropharynx  clear      Nasal turbinates nl  NECK :  without  apparent JVD/ palpable Nodes/TM    LUNGS: no acc muscle use,  Nl contour chest which is clear to A and P bilaterally with  cough at end expiration    CV:  RRR  no s3 or murmur or increase in P2, and no edema   ABD:  soft and nontender with nl inspiratory excursion in the supine position. No bruits or organomegaly appreciated   MS:  Nl gait/ ext warm without deformities Or obvious joint restrictions  calf tenderness, cyanosis or clubbing    SKIN: warm and dry without lesions    NEURO:  alert, approp, nl sensorium with  no motor or cerebellar deficits apparent.   CXR PA and Lateral:   07/05/2022 :    I personally reviewed  images and impression is as follows:     No infiltrates or obvious rib or spine fx     Assessment   Chronic cough Onset 1990s - FENO  07/05/2022  = 12  - cyclical cough protocol 07/05/2022 >>>   End exp cough is typical of asthma and pt has overt rhinitis suggesting an allergy issue but note very low FENO makes this unlikely as well as unimpressive response to saba  Of the three most common causes of  Sub-acute / recurrent or chronic cough, only one (GERD)  can actually contribute to/ trigger  the other two (asthma and post nasal drip syndrome)  and perpetuate the cylce of cough even in the absence of overt HB.  While not intuitively obvious, many patients with chronic low grade reflux do not cough until there is a primary insult that disturbs the protective epithelial barrier and exposes sensitive nerve endings.   This is typically viral but can due to PNDS and  latter more likely to apply here.     >>>The point is that once this occurs, it is difficult to eliminate the cycle  using anything but a maximally effective acid suppression regimen at least in the short run, accompanied by an appropriate diet to address non acid GERD and control  the cough itself for at least 3 days wit tramadol and eliminate the pnds, esp noct, with 1st gen H1 blockers per guidelines  plus >>> also  added Depomedrol 120 mg IM  in case of component of Th-2 driven upper or lower airways inflammation (if cough responds short term only to relapse before return while will on full rx for uacs (as above), then  that would point to allergic rhinitis/ asthma or eos bronchitis as alternative dx)   F/u in 2 weeks to regroup         Each maintenance medication was reviewed in detail including emphasizing most importantly the difference between maintenance and prns and under what circumstances the prns are to be triggered using an action plan format where appropriate.  Total time for H and P, chart review, counseling,   and  generating customized AVS unique to this office visit / same day charting = 45 min for new pt eval of refractory cough of ? Etiology complicated by MS cp brought on by severe cough          Sandrea Hughs, MD 07/05/2022

## 2022-07-05 NOTE — Assessment & Plan Note (Addendum)
Onset 1990s - FENO  07/05/2022  = 12  - cyclical cough protocol 07/05/2022 >>>   End exp cough is typical of asthma and pt has overt rhinitis suggesting an allergy issue but note very low FENO makes this unlikely as well as unimpressive response to saba  Of the three most common causes of  Sub-acute / recurrent or chronic cough, only one (GERD)  can actually contribute to/ trigger  the other two (asthma and post nasal drip syndrome)  and perpetuate the cylce of cough even in the absence of overt HB.  While not intuitively obvious, many patients with chronic low grade reflux do not cough until there is a primary insult that disturbs the protective epithelial barrier and exposes sensitive nerve endings.   This is typically viral but can due to PNDS and  latter more likely to apply here.     >>>The point is that once this occurs, it is difficult to eliminate the cycle  using anything but a maximally effective acid suppression regimen at least in the short run, accompanied by an appropriate diet to address non acid GERD and control  the cough itself for at least 3 days wit tramadol and eliminate the pnds, esp noct, with 1st gen H1 blockers per guidelines  plus >>> also  added Depomedrol 120 mg IM  in case of component of Th-2 driven upper or lower airways inflammation (if cough responds short term only to relapse before return while will on full rx for uacs (as above), then  that would point to allergic rhinitis/ asthma or eos bronchitis as alternative dx)   F/u in 2 weeks to regroup         Each maintenance medication was reviewed in detail including emphasizing most importantly the difference between maintenance and prns and under what circumstances the prns are to be triggered using an action plan format where appropriate.  Total time for H and P, chart review, counseling,   and generating customized AVS unique to this office visit / same day charting = 45 min for new pt eval of refractory cough of  ? Etiology complicated by MS cp brought on by severe cough

## 2022-07-11 ENCOUNTER — Other Ambulatory Visit: Payer: Self-pay | Admitting: Internal Medicine

## 2022-07-11 MED ORDER — AZITHROMYCIN 250 MG PO TABS: 250.0000 mg | ORAL_TABLET | ORAL | 0 refills | Status: DC

## 2022-07-11 NOTE — Progress Notes (Signed)
Spoke with pt and notified of results per Dr. Sherene Sires. Pt verbalized understanding and denied any questions.  Zpack was sent.

## 2022-07-19 ENCOUNTER — Encounter: Payer: Self-pay | Admitting: Internal Medicine

## 2022-07-19 ENCOUNTER — Ambulatory Visit (INDEPENDENT_AMBULATORY_CARE_PROVIDER_SITE_OTHER): Payer: BC Managed Care – PPO

## 2022-07-19 ENCOUNTER — Ambulatory Visit: Payer: BC Managed Care – PPO | Admitting: Internal Medicine

## 2022-07-19 VITALS — BP 134/88 | HR 72 | Temp 98.2°F | Ht 66.0 in | Wt 244.2 lb

## 2022-07-19 DIAGNOSIS — R053 Chronic cough: Secondary | ICD-10-CM

## 2022-07-19 DIAGNOSIS — R059 Cough, unspecified: Secondary | ICD-10-CM | POA: Diagnosis not present

## 2022-07-19 NOTE — Patient Instructions (Addendum)
Stop pantoprazole and take pepcid  20 mg after bfast and take again sometime after supper  For drainage / throat tickle try take CHLORPHENIRAMINE  4 mg  ("Allergy Relief" 4mg   at [facility] should be easiest to find in the blue box usually on bottom shelf)  take one every 4 hours as needed - extremely effective and inexpensive over the counter- may cause drowsiness so start with just a dose or two an hour before bedtime and see how you tolerate it before trying in daytime.   Broth soups/ crackers/ noodles and stop dairy products/ undercooked veggies and salads   GERD (REFLUX)  is an extremely common cause of respiratory symptoms just like yours , many times with no obvious heartburn at all.    It can be treated with medication, but also with lifestyle changes including elevation of the head of your bed (ideally with 6-8inch blocks under the headboard of your bed),  Smoking cessation, avoidance of late meals, excessive alcohol, and avoid fatty foods, chocolate, peppermint, colas, red wine, and acidic juices such as orange juice.  NO MINT OR MENTHOL PRODUCTS SO NO COUGH DROPS  USE SUGARLESS CANDY INSTEAD (Jolley ranchers or Stover's or Life Savers) or even ice chips will also do - the key is to swallow to prevent all throat clearing. NO OIL BASED VITAMINS - use powdered substitutes.  Avoid fish oil when coughing.   Please remember to go to the  x-ray department  for your tests - we will call you with the results when they are available    Please schedule a follow up office visit in 6 weeks, call sooner if needed

## 2022-07-19 NOTE — Progress Notes (Signed)
Crystal Green, female    DOB: Dec 22, 1960   MRN: [id number]   Brief patient profile:  61  yowf never smoker from [place] with no problems @ baseline wt 180  referred to pulmonary clinic 07/05/2022 by Dr Jolaine Click  for cough since 1990s prior to moving to new construction house around 2000  allergy tested with pos for cats, trees "never bad enough for shots"  but no apparent direct correlation between drippy nose and cough or over HB.     History of Present Illness  07/05/2022  Pulmonary/ 1st office eval/Rena Hunke  Chief Complaint  Patient presents with   Consult    Pt states she had Bronchitis  at the end of September and the cough will not go away.  Dyspnea:  Not limited by breathing from desired activities  / no aerobics  Cough: sporadic esp productive first thing in am  Sleep: on side mostly on side/ flat bed / cpap per [facility] SABA use: not much help Severe coughing sneezing fits > one week prior to OV  severed back pain p one episode positional in nature radiated to R flank  Rec First take delsym two tsp every 12 hours and supplement if needed with  tramadol 50 mg up to 1-2 every 4 hours  Depomedrol 120 mg IM  Protonix (pantoprazole) Take 30-60 min before first meal of the day and Pepcid 20 mg one bedtime plus chlorpheniramine 4 mg x 1- 2 at bedtime (both available over the counter)  until cough is completely gone for at least a week without the need for cough suppression GERD diet reviewed, bed blocks rec   Please remember to go to the  x-ray department ? early RLL PNA      07/19/2022  f/u ov/Kolsen Choe re: chronic cough maint on  chlortimeton 4 mg/pepcid 20 mg hs  Chief Complaint  Patient presents with   Follow-up    Cough has improved slightly.   Dyspnea:  not limited but no aerobics  Cough: still coughing noct/ non productive - has not tried the extra dose of H1 yet  Sleeping: no longer using cpap  SABA use: none  02: none  Covid status:   vax x 2/ infected    Since  started tramadol nause, diarrhea since zpak  Cp is gone now   No obvious day to day or daytime variability or assoc excess/ purulent sputum or mucus plugs or hemoptysis or cp or chest tightness, subjective wheeze or overt sinus or hb symptoms.   Sleeping  without nocturnal  or early am exacerbation  of respiratory  c/o's or need for noct saba. Also denies any obvious fluctuation of symptoms with weather or environmental changes or other aggravating or alleviating factors except as outlined above   No unusual exposure hx or h/o childhood pna/ asthma or knowledge of premature birth.  Current Allergies, Complete Past Medical History, Past Surgical History, Family History, and Social History were reviewed in [employer] record.  ROS  The following are not active complaints unless bolded Hoarseness, sore throat, dysphagia, dental problems, itching, sneezing,  nasal congestion or discharge of excess mucus or purulent secretions, ear ache,   fever, chills, sweats, unintended wt loss or wt gain, classically pleuritic or exertional cp,  orthopnea pnd or arm/hand swelling  or leg swelling, presyncope, palpitations, abdominal pain, anorexia, nausea, vomiting, diarrhea  or change in bowel habits or change in bladder habits, change in stools or change in urine, dysuria, hematuria,  rash,  arthralgias, visual complaints, headache, numbness, weakness or ataxia or problems with walking or coordination,  change in mood or  memory.        Current Meds  Medication Sig   atorvastatin (LIPITOR) 40 MG tablet Take 1 tablet (40 mg total) by mouth daily.   chlorpheniramine (CHLOR-TRIMETON) 4 MG tablet Take 4 mg by mouth every 4 (four) hours as needed for allergies.   cholestyramine (QUESTRAN) 4 GM/DOSE powder As needed   famotidine (PEPCID) 20 MG tablet Take 20 mg by mouth at bedtime.   losartan (COZAAR) 25 MG tablet TAKE 1 TABLET BY MOUTH DAILY   pantoprazole (PROTONIX) 40 MG tablet Take 1 tablet  (40 mg total) by mouth daily. Take 30-60 min before first meal of the day   traMADol (ULTRAM) 50 MG tablet Take 50 mg by mouth every 6 (six) hours as needed.   verapamil (CALAN-SR) 120 MG CR tablet Take 1 tablet (120 mg total) by mouth at bedtime.          Objective:    Wt Readings from Last 3 Encounters:  07/19/22 244 lb 3.2 oz (110.8 kg)  07/05/22 253 lb (114.8 kg)  03/07/22 255 lb 12.8 oz (116 kg)      Vital signs reviewed  07/19/2022  - Note at rest 02 sats  98% on RA   General appearance:    amb wf / occ throat clearing    HEENT : Oropharynx  clear     Nasal turbinates nl    NECK :  without  apparent JVD/ palpable Nodes/TM    LUNGS: no acc muscle use,  Nl contour chest which is clear to A and P bilaterally without cough on insp or exp maneuvers   CV:  RRR  no s3 or murmur or increase in P2, and no edema   ABD:  soft and nontender with nl inspiratory excursion in the supine position. No bruits or organomegaly appreciated   MS:  Nl gait/ ext warm without deformities Or obvious joint restrictions  calf tenderness, cyanosis or clubbing    SKIN: warm and dry without lesions    NEURO:  alert, approp, nl sensorium with  no motor or cerebellar deficits apparent.    CXR PA and Lateral:   07/19/2022 :    I personally reviewed images and impression is as follows:       No  AS dz RLL       Assessment

## 2022-07-19 NOTE — Assessment & Plan Note (Signed)
Onset 1990s - FENO  07/05/2022  = 12  - cyclical cough protocol 07/05/2022 >>> no change and nausea/ diarrhea from from meds 07/19/2022 so rec stop tramadol/ ppi/ zpak and rx clear liquids and f/u in 6 weeks to regroup  Would like to first address noct cough with  H1 at hs x 8 mg if tol and if helps/ tolerates can use daytime prn   Continue h2 bid  Elevate HOB as per prev recs  F/u in 6 weeks ? Trial of gabapentin next vs allergic rhinitis re-eval         Each maintenance medication was reviewed in detail including emphasizing most importantly the difference between maintenance and prns and under what circumstances the prns are to be triggered using an action plan format where appropriate.  Total time for H and P, chart review, counseling,  and generating customized AVS unique to this office visit / same day charting = 31 min

## 2022-07-20 ENCOUNTER — Telehealth: Payer: Self-pay | Admitting: Internal Medicine

## 2022-07-20 NOTE — Telephone Encounter (Signed)
Patient checking on results. Patient phone number is [phone].

## 2022-07-20 NOTE — Telephone Encounter (Signed)
Patient called to request the results of her CT that she said was done on 11/29.  Patient stated the doctor told her she would get a call within 24 hrs.  Please advise and call patient to discuss at [phone]

## 2022-07-21 NOTE — Telephone Encounter (Signed)
I spoke with the pt regarding results and she verbalized understanding. Nothing further needed.

## 2022-07-21 NOTE — Progress Notes (Signed)
Spoke with pt and notified of results per Dr. Wert. Pt verbalized understanding and denied any questions. 

## 2022-08-04 DIAGNOSIS — G4733 Obstructive sleep apnea (adult) (pediatric): Secondary | ICD-10-CM | POA: Diagnosis not present

## 2022-08-30 ENCOUNTER — Ambulatory Visit: Payer: BC Managed Care – PPO | Admitting: Internal Medicine

## 2022-09-04 DIAGNOSIS — G4733 Obstructive sleep apnea (adult) (pediatric): Secondary | ICD-10-CM | POA: Diagnosis not present

## 2022-09-13 ENCOUNTER — Ambulatory Visit: Payer: BC Managed Care – PPO | Admitting: Internal Medicine

## 2022-09-13 ENCOUNTER — Encounter: Payer: Self-pay | Admitting: Internal Medicine

## 2022-09-13 VITALS — BP 146/84 | HR 79 | Temp 98.5°F | Ht 66.0 in | Wt 251.0 lb

## 2022-09-13 DIAGNOSIS — R053 Chronic cough: Secondary | ICD-10-CM | POA: Diagnosis not present

## 2022-09-13 MED ORDER — FAMOTIDINE 20 MG PO TABS: ORAL_TABLET | ORAL | 11 refills | Status: DC

## 2022-09-13 NOTE — Assessment & Plan Note (Addendum)
Onset 1990s - FENO  93/71/6967  = 12  - cyclical cough protocol 07/05/2022 >>> no change and nausea/ diarrhea from from meds 07/19/2022 so rec stop tramadol/ ppi/ zpak and rx clear liquids and f/u in 6 weeks to regroup - 09/13/2022 cough resolved to her satisfaction and just maint on pepcid 20 mg bid > pulmonary f/u is prn   C/w Upper airway cough syndrome (previously labeled PNDS),  is so named because it's frequently impossible to sort out how much is  CR/sinusitis with freq throat clearing (which can be related to primary GERD)   vs  causing  secondary (" extra esophageal")  GERD from wide swings in gastric pressure that occur with throat clearing, often  promoting self use of mint and menthol lozenges that reduce the lower esophageal sphincter tone and exacerbate the problem further in a cyclical fashion.   These are the same pts (now being labeled as having "irritable larynx syndrome" by some cough centers) who not infrequently have a history of having failed to tolerate ace inhibitors,  dry powder inhalers or biphosphonates or report having atypical/extraesophageal reflux symptoms that don't respond to standard doses of PPI  and are easily confused as having aecopd or asthma flares by even experienced allergists/ pulmonologists (myself included).   Rec continue prn 1st gen H1 blockers per guidelines  and pepcid 20 mg bid with option to restart short term ppi (protonix 40 or otc prilosec 20 x 2  30 min ac until cough gone for a full week  Pulmonary f/u can be prn          Each maintenance medication was reviewed in detail including emphasizing most importantly the difference between maintenance and prns and under what circumstances the prns are to be triggered using an action plan format where appropriate.  Total time for H and P, chart review, counseling,   and generating customized AVS unique to this office visit / same day charting = 25 min

## 2022-09-13 NOTE — Progress Notes (Signed)
Crystal Green, female    DOB: 03-26-1961   MRN: [id number]   Brief patient profile:  78  yowf never smoker from [place] with no problems @ baseline wt 180  referred to pulmonary clinic 07/05/2022 by Dr Henrine Screws  for cough since 1990s prior to moving to new construction house around 2000  allergy tested with pos for cats, trees "never bad enough for shots"  but no apparent direct correlation between drippy nose and cough or over HB.     History of Present Illness  07/05/2022  Pulmonary/ 1st office eval/Treyon Wymore  Chief Complaint  Patient presents with   Consult    Pt states she had Bronchitis  at the end of September and the cough will not go away.  Dyspnea:  Not limited by breathing from desired activities  / no aerobics  Cough: sporadic esp productive first thing in am  Sleep: on side mostly on side/ flat bed / cpap per [facility] SABA use: not much help Severe coughing sneezing fits > one week prior to OV  severed back pain p one episode positional in nature radiated to R flank  Rec First take delsym two tsp every 12 hours and supplement if needed with  tramadol 50 mg up to 1-2 every 4 hours  Depomedrol 120 mg IM  Protonix (pantoprazole) Take 30-60 min before first meal of the day and Pepcid 20 mg one bedtime plus chlorpheniramine 4 mg x 1- 2 at bedtime (both available over the counter)  until cough is completely gone for at least a week without the need for cough suppression GERD diet reviewed, bed blocks rec   Please remember to go to the  x-ray department ? early RLL PNA      07/19/2022  f/u ov/Assyria Morreale re: chronic cough maint on  chlortimeton 4 mg/pepcid 20 mg hs  Chief Complaint  Patient presents with   Follow-up    Cough has improved slightly.   Dyspnea:  not limited but no aerobics  Cough: still coughing noct/ non productive - has not tried the extra dose of H1 yet  Sleeping: no longer using cpap  SABA use: none  02: none  Covid status:   vax x 2/ infected  Since  started tramadol nausea, diarrhea since zpak  Cp is gone now  Rec Stop pantoprazole and take pepcid  20 mg after bfast and take again sometime after supper For drainage / throat tickle try take CHLORPHENIRAMINE  4 mg  Broth soups/ crackers/ noodles and stop dairy products/ undercooked veggies and salads  GERD diet reviewed, bed blocks rec      09/13/2022  f/u ov/Abid Bolla re: uacs    maint on pepcid 20 mg bid / H1 3-4 x per day   Chief Complaint  Patient presents with   Follow-up    Cough much improved. No wheeze or SOB.   Dyspnea:  not limited by sob Cough: much better vs baseline  Sleeping: ok back on cpap again  SABA use: none  02: none     No obvious day to day or daytime variability or assoc excess/ purulent sputum or mucus plugs or hemoptysis or cp or chest tightness, subjective wheeze or overt sinus or hb symptoms.   Sleeping  without nocturnal  or early am exacerbation  of respiratory  c/o's or need for noct saba. Also denies any obvious fluctuation of symptoms with weather or environmental changes or other aggravating or alleviating factors except as outlined above   No unusual  exposure hx or h/o childhood pna/ asthma or knowledge of premature birth.  Current Allergies, Complete Past Medical History, Past Surgical History, Family History, and Social History were reviewed in [employer] record.  ROS  The following are not active complaints unless bolded Hoarseness, sore throat, dysphagia, dental problems, itching, sneezing,  nasal congestion or discharge of excess mucus or purulent secretions, ear ache,   fever, chills, sweats, unintended wt loss or wt gain, classically pleuritic or exertional cp,  orthopnea pnd or arm/hand swelling  or leg swelling, presyncope, palpitations, abdominal pain, anorexia, nausea, vomiting, diarrhea  or change in bowel habits or change in bladder habits, change in stools or change in urine, dysuria, hematuria,  rash, arthralgias,  visual complaints, headache, numbness, weakness or ataxia or problems with walking or coordination,  change in mood or  memory.        Current Meds  Medication Sig   atorvastatin (LIPITOR) 40 MG tablet Take 1 tablet (40 mg total) by mouth daily.   chlorpheniramine (CHLOR-TRIMETON) 4 MG tablet Take 4 mg by mouth every 4 (four) hours as needed for allergies.   cholestyramine (QUESTRAN) 4 GM/DOSE powder As needed   famotidine (PEPCID) 20 MG tablet Take 20 mg by mouth at bedtime.   losartan (COZAAR) 25 MG tablet TAKE 1 TABLET BY MOUTH DAILY   verapamil (CALAN-SR) 120 MG CR tablet Take 1 tablet (120 mg total) by mouth at bedtime.          Objective:    09/13/2022       251  07/19/22 244 lb 3.2 oz (110.8 kg)  07/05/22 253 lb (114.8 kg)  03/07/22 255 lb 12.8 oz (116 kg)      Vital signs reviewed  09/13/2022  - Note at rest 02 sats  97% on RA   General appearance:    pleasant amb MO (By BMI) wf nad    HEENT : Oropharynx  clear        NECK :  without  apparent JVD/ palpable Nodes/TM    LUNGS: no acc muscle use,  Nl contour chest which is clear to A and P bilaterally without cough on insp or exp maneuvers   CV:  RRR  no s3 or murmur or increase in P2, and no edema   ABD:  soft and nontender    MS:  Nl gait/ ext warm without deformities Or obvious joint restrictions  calf tenderness, cyanosis or clubbing    SKIN: warm and dry without lesions    NEURO:  alert, approp, nl sensorium with  no motor or cerebellar deficits apparent.               Assessment

## 2022-09-13 NOTE — Patient Instructions (Addendum)
Continue pepcid 20 mg one twice daily  - start protonix immediately before 1st meal for any flare until better off all cough meds  x 1 week  Chlorpheniramine 4 mg as needed    If you are satisfied with your treatment plan,  let your doctor know and he/she can either refill your medications or you can return here when your prescription runs out.     If in any way you are not 100% satisfied,  please tell us.  If 100% better, tell your friends!  Pulmonary follow up is as needed

## 2022-09-19 NOTE — Progress Notes (Unsigned)
PATIENT: Crystal Green DOB: 10-28-1960  REASON FOR VISIT: follow up HISTORY FROM: patient PRIMARY NEUROLOGIST: Dr. Vickey Huger  HISTORY OF PRESENT ILLNESS: Today 09/20/22  Crystal Green is a 62 y.o. female with a history of obstructive sleep apnea on CPAP. Returns today for follow-up.  She reports that the CPAP overall is working well.  She does note that there is been nights that she has been able to use it if she has nasal congestion.  She notices on those nights she does not wake up with swollen ankles but when she uses her CPAP she does.  She also noticed some swelling on the face from her mask.      REVIEW OF SYSTEMS: Out of a complete 14 system review of symptoms, the patient complains only of the following symptoms, and all other reviewed systems are negative.  FSS 13 ESS 2       ALLERGIES: Allergies  Allergen Reactions   Sudafed [Pseudoephedrine] Other (See Comments)    jittery   Amoxicillin Rash   Codeine Other (See Comments)    jittery   Penicillins Rash    HOME MEDICATIONS: Outpatient Medications Prior to Visit  Medication Sig Dispense Refill   atorvastatin (LIPITOR) 40 MG tablet Take 1 tablet (40 mg total) by mouth daily. 90 tablet 3   chlorpheniramine (CHLOR-TRIMETON) 4 MG tablet Take 4 mg by mouth every 4 (four) hours as needed for allergies.     cholestyramine (QUESTRAN) 4 GM/DOSE powder As needed  12   famotidine (PEPCID) 20 MG tablet One after breakfast and supper 60 tablet 11   losartan (COZAAR) 25 MG tablet TAKE 1 TABLET BY MOUTH DAILY 90 tablet 3   verapamil (CALAN-SR) 120 MG CR tablet Take 1 tablet (120 mg total) by mouth at bedtime. 90 tablet 3   No facility-administered medications prior to visit.    PAST MEDICAL HISTORY: Past Medical History:  Diagnosis Date   Chronic kidney disease    stage 3   Hypertension     PAST SURGICAL HISTORY: Past Surgical History:  Procedure Laterality Date   ABDOMINAL HYSTERECTOMY     CATARACT  EXTRACTION, BILATERAL  2003   KNEE SURGERY Right 2012   arthroscopic   LAPAROSCOPIC APPENDECTOMY  12/02/2011   Procedure: APPENDECTOMY LAPAROSCOPIC;  Surgeon: Emelia Loron, MD;  Location: [facility] OR;  Service: General;  Laterality: N/A;   TONSILLECTOMY      FAMILY HISTORY: Family History  Problem Relation Age of Onset   Hypertension Mother    Dementia Mother    Hypertension Father    Deep vein thrombosis Brother    Sleep apnea Neg Hx     SOCIAL HISTORY: Social History   Socioeconomic History   Marital status: Married    Spouse name: Not on file   Number of children: 3   Years of education: Not on file   Highest education level: Not on file  Occupational History   Not on file  Tobacco Use   Smoking status: Never    Passive exposure: Never   Smokeless tobacco: Never  Vaping Use   Vaping Use: Never used  Substance and Sexual Activity   Alcohol use: Yes    Comment: rarely   Drug use: Not Currently   Sexual activity: Not on file  Other Topics Concern   Not on file  Social History Narrative   Not on file   Social Determinants of Health   Financial Resource Strain: Not on file  Food Insecurity:  Not on file  Transportation Needs: Not on file  Physical Activity: Not on file  Stress: Not on file  Social Connections: Not on file  Intimate Partner Violence: Not on file      PHYSICAL EXAM  Vitals:   09/20/22 1034  BP: (!) 140/70  Pulse: 77  Weight: 250 lb 3.2 oz (113.5 kg)  Height: 5\' 6"  (1.676 m)   Body mass index is 40.38 kg/m.  Generalized: Well developed, in no acute distress  Chest: Lungs clear to auscultation bilaterally  Neurological examination  Mentation: Alert oriented to time, place, history taking. Follows all commands speech and language fluent Cranial nerve II-XII: Extraocular movements were full, visual field were full on confrontational test Head turning and shoulder shrug  were normal and symmetric.    DIAGNOSTIC DATA (LABS, IMAGING,  TESTING) - I reviewed patient records, labs, notes, testing and imaging myself where available.  Lab Results  Component Value Date   WBC 13.4 (H) 12/01/2011   HGB 12.8 12/01/2011   HCT 38.3 12/01/2011   MCV 89.5 12/01/2011   PLT 208 12/01/2011      Component Value Date/Time   NA 142 10/04/2021 0952   K 4.1 10/04/2021 0952   CL 106 10/04/2021 0952   CO2 20 10/04/2021 0952   GLUCOSE 164 (H) 10/04/2021 0952   GLUCOSE 122 (H) 12/01/2011 1514   BUN 14 10/04/2021 0952   CREATININE 0.90 10/04/2021 0952   CALCIUM 9.0 10/04/2021 0952   PROT 7.4 12/01/2011 1514   ALBUMIN 4.1 12/01/2011 1514   AST 20 12/01/2011 1514   ALT 15 12/01/2011 1514   ALKPHOS 114 12/01/2011 1514   BILITOT 1.0 12/01/2011 1514   GFRNONAA >90 12/01/2011 1514   GFRAA >90 12/01/2011 1514      ASSESSMENT AND PLAN 62 y.o. year old female  has a past medical history of Chronic kidney disease and Hypertension. here with:  OSA on CPAP  - CPAP compliance excellent - Good treatment of AHI  - Encourage patient to use CPAP nightly and > 4 hours each night - Mask refitting ordered-a different mask to be beneficial for facial swelling -Will also decrease pressure 5 to 15 cm of water - Not sure how the CPAP would be the cause of ankle swelling? - F/U in 1 year or sooner if needed    Butch Penny, MSN, NP-C 09/20/2022, 2:24 PM [facility] [address], [address] [place], [place] [address] [phone]

## 2022-09-20 ENCOUNTER — Ambulatory Visit: Payer: BC Managed Care – PPO | Admitting: Adult Health

## 2022-09-20 ENCOUNTER — Encounter: Payer: Self-pay | Admitting: Adult Health

## 2022-09-20 VITALS — BP 140/70 | HR 77 | Ht 66.0 in | Wt 250.2 lb

## 2022-09-20 DIAGNOSIS — G4733 Obstructive sleep apnea (adult) (pediatric): Secondary | ICD-10-CM

## 2022-10-05 DIAGNOSIS — G4733 Obstructive sleep apnea (adult) (pediatric): Secondary | ICD-10-CM | POA: Diagnosis not present

## 2022-10-27 ENCOUNTER — Other Ambulatory Visit: Payer: Self-pay

## 2022-10-27 MED ORDER — VERAPAMIL HCL ER 120 MG PO TBCR: 120.0000 mg | EXTENDED_RELEASE_TABLET | Freq: Every day | ORAL | 0 refills | Status: DC

## 2022-10-27 MED ORDER — ATORVASTATIN CALCIUM 40 MG PO TABS: 40.0000 mg | ORAL_TABLET | Freq: Every day | ORAL | 0 refills | Status: DC

## 2022-11-07 ENCOUNTER — Telehealth: Payer: Self-pay | Admitting: *Deleted

## 2022-11-07 NOTE — Telephone Encounter (Addendum)
I called the patient and LVM (Ok per [facility]) advising that [place] NP spoke with Dr Brett Fairy who does not see a correlation between using CPAP and her ankles swelling and that it should actually make the ankle swelling better. I have advised the patient to discuss the ankle swelling with her PCP or cardiologist but it should not be related to CPAP. Left office number if she needs to call us back.   ----- Message from Ward Givens, NP sent at 11/03/2022 11:16 AM EDT ----- Please call patient and let her know that Dr. Brett Fairy does not see the correlation to using CPAP and ankle swelling. It should make ankle swelling better.

## 2023-01-24 ENCOUNTER — Other Ambulatory Visit: Payer: Self-pay | Admitting: Cardiology

## 2023-01-26 DIAGNOSIS — G4733 Obstructive sleep apnea (adult) (pediatric): Secondary | ICD-10-CM | POA: Diagnosis not present

## 2023-02-08 ENCOUNTER — Other Ambulatory Visit: Payer: Self-pay

## 2023-02-08 MED ORDER — LOSARTAN POTASSIUM 25 MG PO TABS: 25.0000 mg | ORAL_TABLET | Freq: Every day | ORAL | 3 refills | Status: DC

## 2023-02-25 DIAGNOSIS — G4733 Obstructive sleep apnea (adult) (pediatric): Secondary | ICD-10-CM | POA: Diagnosis not present

## 2023-02-27 DIAGNOSIS — E039 Hypothyroidism, unspecified: Secondary | ICD-10-CM | POA: Diagnosis not present

## 2023-02-27 DIAGNOSIS — N182 Chronic kidney disease, stage 2 (mild): Secondary | ICD-10-CM | POA: Diagnosis not present

## 2023-02-27 DIAGNOSIS — R7303 Prediabetes: Secondary | ICD-10-CM | POA: Diagnosis not present

## 2023-02-27 DIAGNOSIS — Z0000 Encounter for general adult medical examination without abnormal findings: Secondary | ICD-10-CM | POA: Diagnosis not present

## 2023-02-27 DIAGNOSIS — I10 Essential (primary) hypertension: Secondary | ICD-10-CM | POA: Diagnosis not present

## 2023-03-06 DIAGNOSIS — D171 Benign lipomatous neoplasm of skin and subcutaneous tissue of trunk: Secondary | ICD-10-CM | POA: Diagnosis not present

## 2023-03-06 DIAGNOSIS — N182 Chronic kidney disease, stage 2 (mild): Secondary | ICD-10-CM | POA: Diagnosis not present

## 2023-03-06 DIAGNOSIS — Z0000 Encounter for general adult medical examination without abnormal findings: Secondary | ICD-10-CM | POA: Diagnosis not present

## 2023-03-06 DIAGNOSIS — E1165 Type 2 diabetes mellitus with hyperglycemia: Secondary | ICD-10-CM | POA: Diagnosis not present

## 2023-03-06 DIAGNOSIS — E782 Mixed hyperlipidemia: Secondary | ICD-10-CM | POA: Diagnosis not present

## 2023-03-06 IMAGING — MG MM DIGITAL SCREENING BILAT W/ TOMO AND CAD
8 series · 8 of 24 positions shown · non-contrast
Comparison: Previous exam(s).

CLINICAL DATA: Screening.

EXAM:
DIGITAL SCREENING BILATERAL MAMMOGRAM WITH TOMOSYNTHESIS AND CAD
TECHNIQUE: Bilateral screening digital craniocaudal and mediolateral oblique
mammograms were obtained. Bilateral screening digital breast
tomosynthesis was performed. The images were evaluated with
computer-aided detection.

[R CC synth-2D]
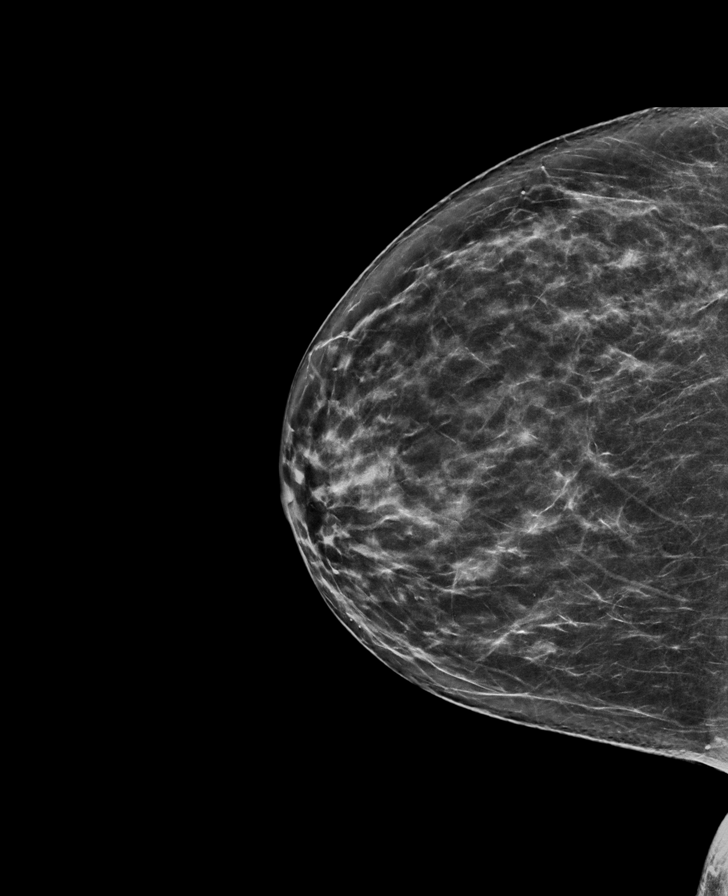

[R MLO synth-2D]
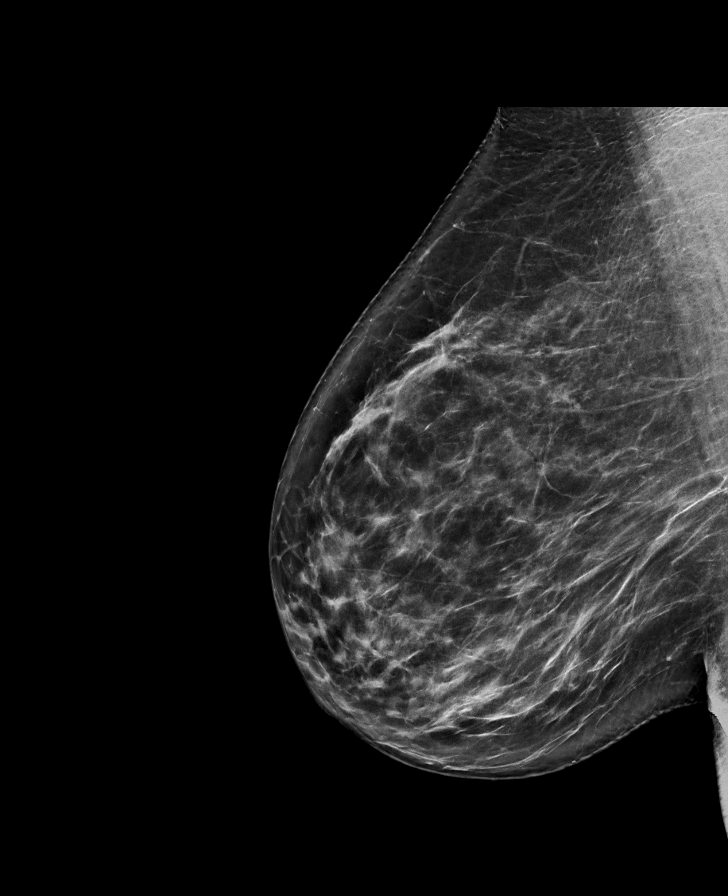

[L MLO synth-2D]
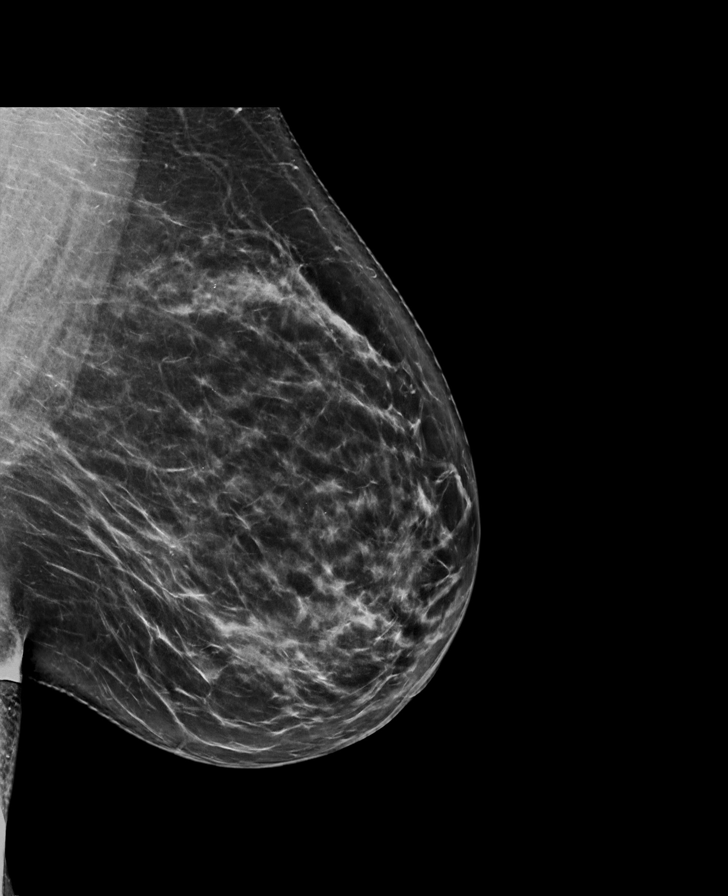

[L CC synth-2D]
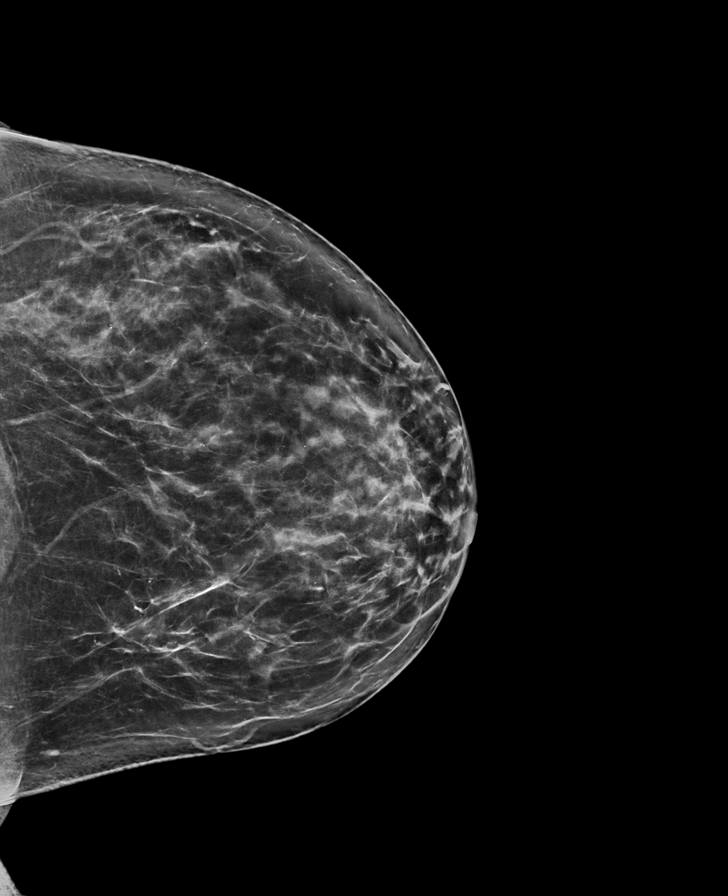

[L MLO tomo · tomo slice 45/88.0]
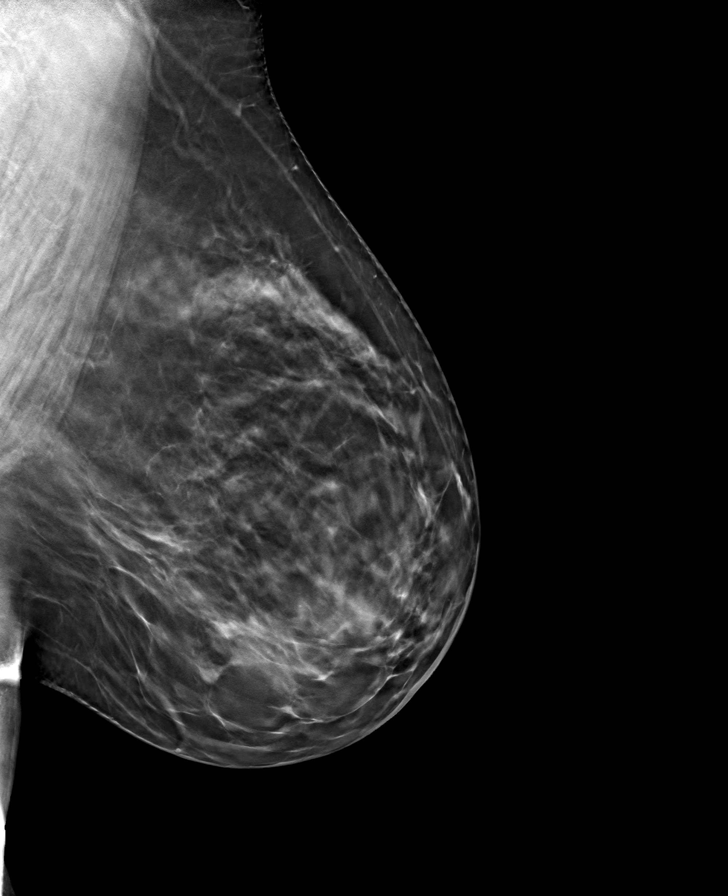

[R MLO tomo · tomo slice 44/87.0]
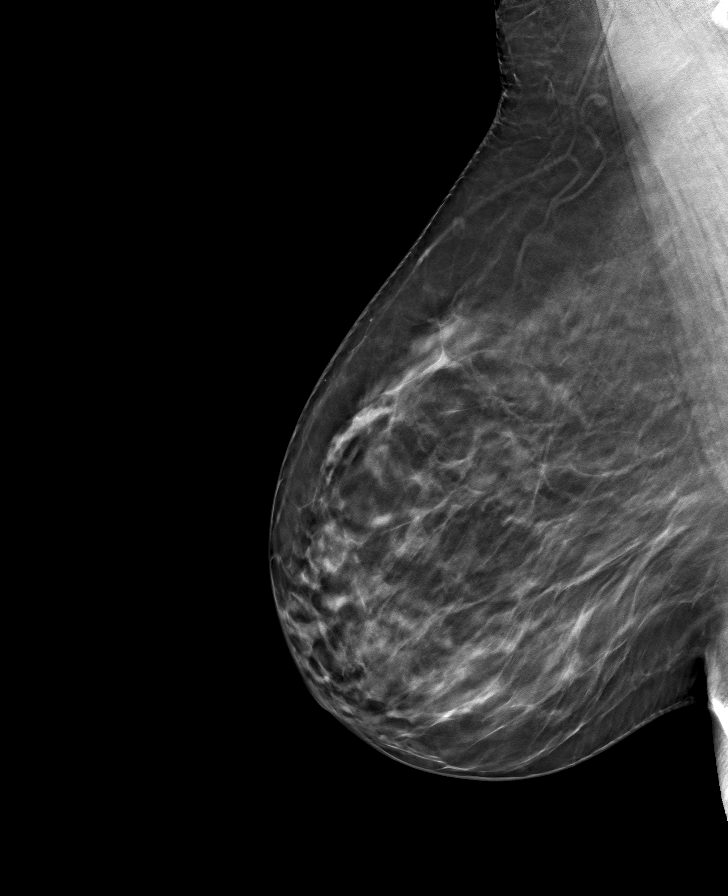

[R CC tomo · tomo slice 38/75.0]
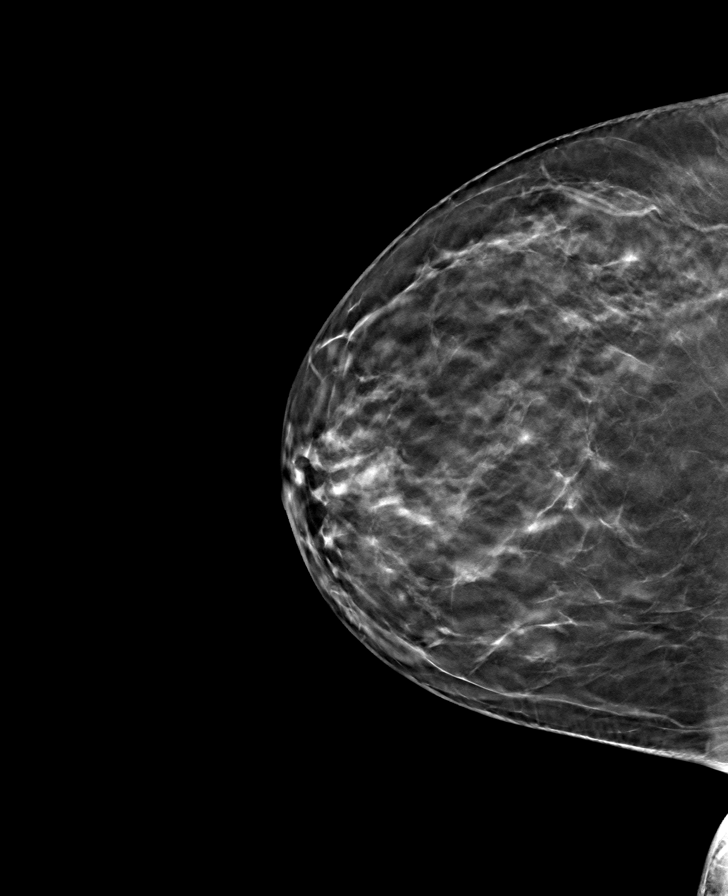

[L CC tomo · tomo slice 43/84.0]
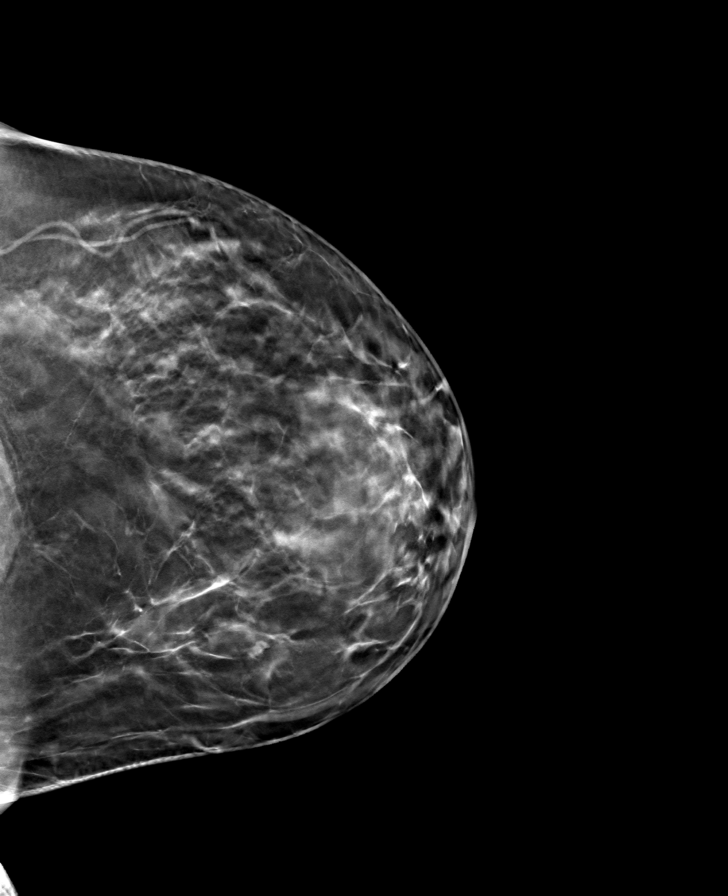

[8 of 24 positions shown; findings below may reference images not displayed]

ACR Breast Density Category c: The breast tissue is heterogeneously
dense, which may obscure small masses.
FINDINGS: There are no findings suspicious for malignancy. The images were
evaluated with computer-aided detection.
IMPRESSION: No mammographic evidence of malignancy. A result letter of this
screening mammogram will be mailed directly to the patient.

RECOMMENDATION:
Screening mammogram in one year. (Code:T4-5-GWO)

BI-RADS CATEGORY  1: Negative.

## 2023-03-07 DIAGNOSIS — E039 Hypothyroidism, unspecified: Secondary | ICD-10-CM | POA: Diagnosis not present

## 2023-03-07 DIAGNOSIS — E1165 Type 2 diabetes mellitus with hyperglycemia: Secondary | ICD-10-CM | POA: Diagnosis not present

## 2023-03-07 DIAGNOSIS — N182 Chronic kidney disease, stage 2 (mild): Secondary | ICD-10-CM | POA: Diagnosis not present

## 2023-03-07 DIAGNOSIS — I10 Essential (primary) hypertension: Secondary | ICD-10-CM | POA: Diagnosis not present

## 2023-03-08 ENCOUNTER — Encounter: Payer: Self-pay | Admitting: Cardiology

## 2023-03-08 ENCOUNTER — Ambulatory Visit: Payer: BC Managed Care – PPO | Admitting: Cardiology

## 2023-03-08 VITALS — BP 156/92 | HR 82 | Resp 16 | Ht 66.0 in | Wt 251.4 lb

## 2023-03-08 DIAGNOSIS — I10 Essential (primary) hypertension: Secondary | ICD-10-CM | POA: Diagnosis not present

## 2023-03-08 DIAGNOSIS — E782 Mixed hyperlipidemia: Secondary | ICD-10-CM | POA: Diagnosis not present

## 2023-03-08 DIAGNOSIS — R9431 Abnormal electrocardiogram [ECG] [EKG]: Secondary | ICD-10-CM | POA: Diagnosis not present

## 2023-03-08 DIAGNOSIS — R002 Palpitations: Secondary | ICD-10-CM | POA: Diagnosis not present

## 2023-03-08 DIAGNOSIS — E1165 Type 2 diabetes mellitus with hyperglycemia: Secondary | ICD-10-CM

## 2023-03-08 DIAGNOSIS — N182 Chronic kidney disease, stage 2 (mild): Secondary | ICD-10-CM | POA: Diagnosis not present

## 2023-03-08 MED ORDER — LOSARTAN POTASSIUM-HCTZ 50-12.5 MG PO TABS: 1.0000 | ORAL_TABLET | ORAL | 2 refills | Status: DC

## 2023-03-08 MED ORDER — EMPAGLIFLOZIN 25 MG PO TABS: 25.0000 mg | ORAL_TABLET | Freq: Every day | ORAL | 0 refills | Status: DC

## 2023-03-08 NOTE — Patient Instructions (Signed)
Please do not take verapamil 1 day prior to your stress test.

## 2023-03-08 NOTE — Progress Notes (Signed)
Primary Physician/Referring:  Georgianne Fick, MD  [name], female    DOB: Jan 28, 1961, 62 y.o.   MRN: [id number]  Chief Complaint  Patient presents with   Palpitations   Follow-up    1 year   HPI:    Crystal Green  is a 62 y.o. Caucasian female with primary hypertension, very mild stage II chronic kidney disease, hypercholesterolemia, OSA on CPAP, morbid obesity, now frankly diabetic and uncontrolled presents here for follow-up of hypertension and palpitations.  Patient symptoms of palpitations have essentially resolved since being on verapamil.  However she has been very disturbed due to continued weight gain, now she is frankly diabetic.  She also states that her activity levels have been limited due to back pain and arthritis in her knee.  She feels fatigued and decreased exercise tolerance.  Past Medical History:  Diagnosis Date   Chronic kidney disease    stage 3   Hypertension    Past Surgical History:  Procedure Laterality Date   ABDOMINAL HYSTERECTOMY     CATARACT EXTRACTION, BILATERAL  2003   KNEE SURGERY Right 2012   arthroscopic   LAPAROSCOPIC APPENDECTOMY  12/02/2011   Procedure: APPENDECTOMY LAPAROSCOPIC;  Surgeon: Emelia Loron, MD;  Location: [facility] OR;  Service: General;  Laterality: N/A;   TONSILLECTOMY     Family History  Problem Relation Age of Onset   Hypertension Mother    Dementia Mother    Hypertension Father    Deep vein thrombosis Brother    Sleep apnea Neg Hx     Social History   Tobacco Use   Smoking status: Never    Passive exposure: Never   Smokeless tobacco: Never  Substance Use Topics   Alcohol use: Yes    Comment: rarely   Marital Status: Married   ROS  Review of Systems  Cardiovascular:  Negative for chest pain, dyspnea on exertion and leg swelling.   Objective  Blood pressure (!) 156/92, pulse 82, resp. rate 16, height 5\' 6"  (1.676 m), weight 251 lb 6.4 oz (114 kg), SpO2 95%.     03/08/2023   10:22 AM  09/20/2022   10:34 AM 09/13/2022   11:22 AM  Vitals with BMI  Height 5\' 6"  5\' 6"  5\' 6"   Weight 251 lbs 6 oz 250 lbs 3 oz 251 lbs  BMI 40.6 40.4 40.53  Systolic 156 140 829  Diastolic 92 70 84  Pulse 82 77 79    Physical Exam Constitutional:      Appearance: She is morbidly obese.  Neck:     Vascular: No carotid bruit or JVD.  Cardiovascular:     Rate and Rhythm: Normal rate and regular rhythm.     Pulses: Intact distal pulses.     Heart sounds: Normal heart sounds. No murmur heard.    No gallop.  Pulmonary:     Effort: Pulmonary effort is normal.     Breath sounds: Normal breath sounds.  Abdominal:     General: Bowel sounds are normal.     Palpations: Abdomen is soft.  Musculoskeletal:     Right lower leg: No edema.     Left lower leg: No edema.    Laboratory examination:   External labs:   Labs 02/27/2023:  A1c 8.3%.  TSH 7.580, elevated.  Serum glucose 192 mg, BUN 13, creatinine 1.04, EGFR 60 to mL, potassium 4.0, serum bilirubin chronically elevated 1.7.  LFTs were normal.  Total cholesterol 131, triglycerides 140, HDL 41,  LDL 65.  Radiology:   No results found.  Cardiac Studies:   Bilateral carotid artery 11/01/2017: No significant stenosis bilaterally.  PCV ECHOCARDIOGRAM COMPLETE 06/21/2021 Left ventricle cavity is normal in size. Mild concentric hypertrophy of the left ventricle. Normal global wall motion. Normal LV systolic function with EF 66%. Normal diastolic filling pattern. Mild (Grade I) mitral regurgitation. Mild tricuspid regurgitation. No evidence of pulmonary hypertension. Mild MR, TR new since previous study in 2013.   EKG:   EKG 03/08/2023: Normal sinus rhythm with rate of 71 bpm, normal axis, diffuse nonspecific ST depression.  Compared to 03/07/2022, no significant change.  Allergies   Allergies  Allergen Reactions   Metformin And Related Diarrhea   Sudafed [Pseudoephedrine] Other (See Comments)    jittery   Amoxicillin Rash    Codeine Other (See Comments)    jittery   Penicillins Rash    Current Outpatient Medications:    atorvastatin (LIPITOR) 40 MG tablet, TAKE 1 TABLET BY MOUTH DAILY, Disp: 90 tablet, Rfl: 3   empagliflozin (JARDIANCE) 25 MG TABS tablet, Take 1 tablet (25 mg total) by mouth daily before breakfast., Disp: 30 tablet, Rfl: 0   losartan-hydrochlorothiazide (HYZAAR) 50-12.5 MG tablet, Take 1 tablet by mouth every morning., Disp: 30 tablet, Rfl: 2   verapamil (CALAN-SR) 120 MG CR tablet, TAKE 1 TABLET BY MOUTH AT  BEDTIME, Disp: 90 tablet, Rfl: 3   cholestyramine (QUESTRAN) 4 GM/DOSE powder, As needed (Patient not taking: Reported on 03/08/2023), Disp: , Rfl: 12   Assessment     ICD-10-CM   1. Palpitations  R00.2 EKG 12-Lead    2. Essential hypertension  I10 PCV ECHOCARDIOGRAM COMPLETE    PCV MYOCARDIAL PERFUSION WO LEXISCAN    losartan-hydrochlorothiazide (HYZAAR) 50-12.5 MG tablet    3. Type 2 diabetes mellitus with hyperglycemia, without long-term current use of insulin (HCC)  E11.65 PCV ECHOCARDIOGRAM COMPLETE    PCV MYOCARDIAL PERFUSION WO LEXISCAN    empagliflozin (JARDIANCE) 25 MG TABS tablet    4. Abnormal electrocardiogram  R94.31 PCV ECHOCARDIOGRAM COMPLETE    PCV MYOCARDIAL PERFUSION WO LEXISCAN       Medications Discontinued During This Encounter  Medication Reason   chlorpheniramine (CHLOR-TRIMETON) 4 MG tablet    famotidine (PEPCID) 20 MG tablet    losartan (COZAAR) 25 MG tablet Change in therapy   metformin (FORTAMET) 500 MG (OSM) 24 hr tablet Side effect (s)     Meds ordered this encounter  Medications   losartan-hydrochlorothiazide (HYZAAR) 50-12.5 MG tablet    Sig: Take 1 tablet by mouth every morning.    Dispense:  30 tablet    Refill:  2   empagliflozin (JARDIANCE) 25 MG TABS tablet    Sig: Take 1 tablet (25 mg total) by mouth daily before breakfast.    Dispense:  30 tablet    Refill:  0   Recommendations:   Crystal Green is a 62 y.o. Caucasian female with  primary hypertension, very mild stage II chronic kidney disease, hypercholesterolemia, OSA on CPAP, morbid obesity, now frankly diabetic and uncontrolled presents here for follow-up of hypertension and palpitations.  1. Palpitations Since being on verapamil, she is completely asymptomatic.  Continue the same. - EKG 12-Lead  2. Essential hypertension Blood pressure is elevated, will change losartan 25 mg to losartan HCT 50/12.5 mg in the morning.  She is seeing Dennie Maizes, NP from endocrine standpoint in 2 weeks and we will request her to do BMP and she is following up with both  diabetes and thyroid dysfunction.  - PCV ECHOCARDIOGRAM COMPLETE; Future - PCV MYOCARDIAL PERFUSION WO LEXISCAN; Future - losartan-hydrochlorothiazide (HYZAAR) 50-12.5 MG tablet; Take 1 tablet by mouth every morning.  Dispense: 30 tablet; Refill: 2  3. Type 2 diabetes mellitus with hyperglycemia, without long-term current use of insulin (HCC) Patient has now frankly diabetic with hyperglycemia.  She was started on metformin but unfortunately she has no started having severe diarrhea.  Advised her to discontinue metformin.  I will go ahead and take the liberty of starting her on Jardiance 25 mg daily, Rx has been sent and we will request Shanda Bumps to take over the prescription if she feels it is appropriate.  I have discussed regarding hygiene and UTI.  - PCV ECHOCARDIOGRAM COMPLETE; Future - PCV MYOCARDIAL PERFUSION WO LEXISCAN; Future - empagliflozin (JARDIANCE) 25 MG TABS tablet; Take 1 tablet (25 mg total) by mouth daily before breakfast.  Dispense: 30 tablet; Refill: 0  4. Abnormal electrocardiogram Patient has abnormal EKG and has not had any ischemic workup.  In view of diabetes mellitus, hypertension, hypercholesterolemia, morbid obesity, we will set her up for an echocardiogram and also myocardial perfusion scan.  I will see her back in 2 months for follow-up.  - PCV ECHOCARDIOGRAM COMPLETE; Future - PCV  MYOCARDIAL PERFUSION WO LEXISCAN; Future   Yates Decamp, MD, [facility] 03/08/2023, 9:39 PM Office: [phone] Fax: [phone] Pager: [phone]

## 2023-03-27 DIAGNOSIS — E1165 Type 2 diabetes mellitus with hyperglycemia: Secondary | ICD-10-CM | POA: Diagnosis not present

## 2023-03-27 DIAGNOSIS — E782 Mixed hyperlipidemia: Secondary | ICD-10-CM | POA: Diagnosis not present

## 2023-03-27 DIAGNOSIS — N182 Chronic kidney disease, stage 2 (mild): Secondary | ICD-10-CM | POA: Diagnosis not present

## 2023-03-28 DIAGNOSIS — G4733 Obstructive sleep apnea (adult) (pediatric): Secondary | ICD-10-CM | POA: Diagnosis not present

## 2023-04-02 ENCOUNTER — Ambulatory Visit: Payer: BC Managed Care – PPO

## 2023-04-02 DIAGNOSIS — I10 Essential (primary) hypertension: Secondary | ICD-10-CM

## 2023-04-02 DIAGNOSIS — R9431 Abnormal electrocardiogram [ECG] [EKG]: Secondary | ICD-10-CM | POA: Diagnosis not present

## 2023-04-02 DIAGNOSIS — E1165 Type 2 diabetes mellitus with hyperglycemia: Secondary | ICD-10-CM

## 2023-04-06 ENCOUNTER — Other Ambulatory Visit: Payer: Self-pay | Admitting: Cardiology

## 2023-04-06 DIAGNOSIS — E1165 Type 2 diabetes mellitus with hyperglycemia: Secondary | ICD-10-CM

## 2023-04-06 NOTE — Progress Notes (Signed)
Echocardiogram 04/02/2023: Left ventricle cavity is normal in size. Normal left ventricular wall thickness. Normal global wall motion. Normal LV systolic function with EF 74%. Doppler evidence of grade I (impaired) diastolic dysfunction, normal LAP.  Mild tricuspid regurgitation.  No evidence of pulmonary hypertension. Mild MR noted on 11/12022 not appreciated on this study.

## 2023-04-11 ENCOUNTER — Encounter: Payer: Self-pay | Admitting: Cardiology

## 2023-04-11 NOTE — Progress Notes (Signed)
Called and spoke with patient regarding her stress and echocardiogram results.

## 2023-04-11 NOTE — Progress Notes (Signed)
Normal stress test and normal echocardiogram.  Exercise nuclear stress test 04/02/2023: Myocardial perfusion is normal. Overall LV systolic function is normal without regional wall motion abnormalities. Stress LV EF: 59%.  Normal ECG stress. The patient exercised for 6 minutes and 30 seconds of a Bruce protocol, achieving approximately 7.82 METs & 91% MPHR. No chest pain. Stress terminated due to THR.  The heart rate response was normal. The blood pressure response was normal. No previous exam available for comparison. Low risk.

## 2023-04-19 ENCOUNTER — Other Ambulatory Visit: Payer: Self-pay | Admitting: Internal Medicine

## 2023-04-19 DIAGNOSIS — Z1231 Encounter for screening mammogram for malignant neoplasm of breast: Secondary | ICD-10-CM

## 2023-04-20 ENCOUNTER — Ambulatory Visit
Admission: RE | Admit: 2023-04-20 | Discharge: 2023-04-20 | Disposition: A | Discharge status: Home / Self Care | Payer: BC Managed Care – PPO | Source: Ambulatory Visit | Attending: Internal Medicine | Admitting: Internal Medicine

## 2023-04-20 DIAGNOSIS — Z1231 Encounter for screening mammogram for malignant neoplasm of breast: Secondary | ICD-10-CM | POA: Diagnosis not present

## 2023-04-26 DIAGNOSIS — H43812 Vitreous degeneration, left eye: Secondary | ICD-10-CM | POA: Diagnosis not present

## 2023-04-26 DIAGNOSIS — H43393 Other vitreous opacities, bilateral: Secondary | ICD-10-CM | POA: Diagnosis not present

## 2023-04-26 DIAGNOSIS — H53143 Visual discomfort, bilateral: Secondary | ICD-10-CM | POA: Diagnosis not present

## 2023-04-26 DIAGNOSIS — H43392 Other vitreous opacities, left eye: Secondary | ICD-10-CM | POA: Diagnosis not present

## 2023-04-26 DIAGNOSIS — H43391 Other vitreous opacities, right eye: Secondary | ICD-10-CM | POA: Diagnosis not present

## 2023-05-07 ENCOUNTER — Ambulatory Visit: Payer: BC Managed Care – PPO | Admitting: Cardiology

## 2023-05-14 ENCOUNTER — Other Ambulatory Visit: Payer: Self-pay | Admitting: Cardiology

## 2023-05-14 DIAGNOSIS — E1165 Type 2 diabetes mellitus with hyperglycemia: Secondary | ICD-10-CM

## 2023-05-21 ENCOUNTER — Other Ambulatory Visit: Payer: Self-pay | Admitting: Cardiology

## 2023-05-21 DIAGNOSIS — I10 Essential (primary) hypertension: Secondary | ICD-10-CM

## 2023-05-25 ENCOUNTER — Ambulatory Visit: Payer: BC Managed Care – PPO | Attending: Cardiology | Admitting: Cardiology

## 2023-05-25 ENCOUNTER — Encounter: Payer: Self-pay | Admitting: Cardiology

## 2023-05-25 VITALS — BP 128/62 | HR 85 | Resp 16 | Ht 66.0 in | Wt 230.4 lb

## 2023-05-25 DIAGNOSIS — E1165 Type 2 diabetes mellitus with hyperglycemia: Secondary | ICD-10-CM | POA: Diagnosis not present

## 2023-05-25 DIAGNOSIS — I10 Essential (primary) hypertension: Secondary | ICD-10-CM | POA: Diagnosis not present

## 2023-05-25 DIAGNOSIS — R9431 Abnormal electrocardiogram [ECG] [EKG]: Secondary | ICD-10-CM | POA: Diagnosis not present

## 2023-05-25 NOTE — Patient Instructions (Signed)
Medication Instructions:  Your physician recommends that you continue on your current medications as directed. Please refer to the Current Medication list given to you today.  *If you need a refill on your cardiac medications before your next appointment, please call your pharmacy*  Lab Work: None ordered If you have labs (blood work) drawn today and your tests are completely normal, you will receive your results only by: MyChart Message (if you have MyChart) OR A paper copy in the mail If you have any lab test that is abnormal or we need to change your treatment, we will call you to review the results.  Testing/Procedures: None ordered  Follow-Up: At [facility], you and your health needs are our priority.  As part of our continuing mission to provide you with exceptional heart care, we have created designated Provider Care Teams.  These Care Teams include your primary Cardiologist (physician) and Advanced Practice Providers (APPs -  Physician Assistants and Nurse Practitioners) who all work together to provide you with the care you need, when you need it.  We recommend signing up for the patient portal called "MyChart".  Sign up information is provided on this After Visit Summary.  MyChart is used to connect with patients for Virtual Visits (Telemedicine).  Patients are able to view lab/test results, encounter notes, upcoming appointments, etc.  Non-urgent messages can be sent to your provider as well.   To learn more about what you can do with MyChart, go to [email or web address].    Your next appointment:   As needed  The format for your next appointment:   In Person  Provider:   Yates Decamp, MD {

## 2023-05-25 NOTE — Progress Notes (Unsigned)
Cardiology Office Note:  .   Date:  05/26/2023  ID:  Crystal Green, DOB 02-20-1961, MRN [id number] PCP: Georgianne Fick, MD  [facility] Providers Cardiologist:  Yates Decamp, MD    History of Present Illness: .   Crystal Green is a 62 y.o. Caucasian female with primary hypertension, very mild stage II chronic kidney disease, hypercholesterolemia, OSA on CPAP, morbid obesity, now frankly diabetic and uncontrolled presents here for follow-up of hypertension, chronic palpitations, abnormal EKG.  She underwent echocardiogram and nuclear stress test which revealed normal LVEF and no evidence of ischemia in August 2024.  I had also started her on Jardiance for diabetes mellitus as well and for hypertension and leg edema.  She now presents for follow-up.  States that she has lost about 21 pounds and leg edema has completely resolved.  Discussed the use of AI scribe software for clinical note transcription with the patient, who gave verbal consent to proceed.  History of Present Illness   Crystal Green, a patient with a history of diabetes and cardiac issues, presents for a follow-up visit. She reports that she has been feeling better since starting Jardiance, a medication prescribed for her diabetes. She has lost 21 pounds since her last visit and reports feeling less fluid retention and improved mobility. However, she has hit a plateau in her weight loss and is concerned about her morning blood sugar levels, which she feels are still too high.  In addition to her diabetes, Crystal Green has a history of syncope, which was initially attributed to mitral valve prolapse during her college years. She reports an episode where she experienced a bright white vision and a rapid pulse during a hockey practice, which led her to back off from strenuous physical activities. She has not had any similar episodes since starting her current medication regimen.  Crystal Green also mentions a high TSH level, which she is scheduled to  discuss with an endocrinologist in October.        Review of Systems  Cardiovascular:  Negative for chest pain, dyspnea on exertion and leg swelling.    Risk Assessment/Calculations:     Lab Results  Component Value Date   NA 142 10/04/2021   K 4.1 10/04/2021   CO2 20 10/04/2021   GLUCOSE 164 (H) 10/04/2021   BUN 14 10/04/2021   CREATININE 0.90 10/04/2021   CALCIUM 9.0 10/04/2021   EGFR 74 10/04/2021   GFRNONAA >90 12/01/2011   Lab Results  Component Value Date   WBC 13.4 (H) 12/01/2011   HGB 12.8 12/01/2011   HCT 38.3 12/01/2011   MCV 89.5 12/01/2011   PLT 208 12/01/2011    External Labs:  Labs 02/27/2023:   A1c 8.3%.  TSH 7.580, elevated.   Serum glucose 192 mg, BUN 13, creatinine 1.04, EGFR 60 to mL, potassium 4.0, serum bilirubin chronically elevated 1.7.  LFTs were normal.   Total cholesterol 131, triglycerides 140, HDL 41, LDL 65.  Physical Exam:   VS:  BP 128/62 (BP Location: Right Arm, Patient Position: Sitting, Cuff Size: Large)   Pulse 85   Resp 16   Ht 5\' 6"  (1.676 m)   Wt 230 lb 6.4 oz (104.5 kg)   SpO2 96%   BMI 37.19 kg/m    Wt Readings from Last 3 Encounters:  05/25/23 230 lb 6.4 oz (104.5 kg)  03/08/23 251 lb 6.4 oz (114 kg)  09/20/22 250 lb 3.2 oz (113.5 kg)     Physical Exam Constitutional:  Appearance: She is obese.  Neck:     Vascular: No carotid bruit or JVD.  Cardiovascular:     Rate and Rhythm: Normal rate and regular rhythm.     Pulses: Intact distal pulses.     Heart sounds: Normal heart sounds. No murmur heard.    No gallop.  Pulmonary:     Effort: Pulmonary effort is normal.     Breath sounds: Normal breath sounds.  Abdominal:     General: Bowel sounds are normal.     Palpations: Abdomen is soft.  Musculoskeletal:     Right lower leg: No edema.     Left lower leg: No edema.    Studies Reviewed: Marland Kitchen       EKG 03/08/2023: Normal sinus rhythm with rate of 71 bpm, normal axis, diffuse nonspecific ST depression.  Compared to 03/07/2022, no significant change.   Exercise nuclear stress test 04/02/2023: Myocardial perfusion is normal. Overall LV systolic function is normal without regional wall motion abnormalities. Stress LV EF: 59%. Normal ECG stress. The patient exercised for 6 minutes and 30 seconds of a Bruce protocol, achieving approximately 7.82 METs & 91% MPHR. No chest pain. Stress terminated due to THR. The heart rate response was normal. The blood pressure response was normal. No previous exam available for comparison. Low risk.  Echocardiogram 04/02/2023: Left ventricle cavity is normal in size. Normal left ventricular wall thickness. Normal global wall motion. Normal LV systolic function with EF 74%. Doppler evidence of grade I (impaired) diastolic dysfunction, normal LAP. Mild tricuspid regurgitation. No evidence of pulmonary hypertension. Mild MR noted on 11/12022 not appreciated on this study.  ASSESSMENT AND PLAN: .      ICD-10-CM   1. Essential hypertension  I10     2. Type 2 diabetes mellitus with hyperglycemia, without long-term current use of insulin (HCC)  E11.65     3. Abnormal electrocardiogram  R94.31       Assessment and Plan    Primary hypertension Well-controlled on losartan HCT and verapamil 120 mg daily, with also resolution of palpitations as well. Mild Diastolic Dysfunction on echocardiogram Likely due to aging, high blood pressure, high cholesterol, diabetes, and obesity. Patient has lost weight and is feeling better. -Encourage continued weight loss and daily exercise to further improve heart function.  Diabetes Mellitus Patient has lost 21 pounds since starting Jardiance. Morning blood sugars are still higher than desired. -Continue Jardiance. -Recommend adding Mounjaro to further control blood sugars and possibly for weight loss benefits. -Encourage patient to discuss this with endocrinologist at next appointment in October.  Abnormal EKG Patient has  had normal stress EKG and normal perfusion in August 2024 and is overall low risk.  Continue primary prevention and aggressive risk modification.  General Health Maintenance -Encourage continued weight loss and daily exercise. -Check in with endocrinologist in October to discuss blood sugar control and thyroid function.   I will see her back on a as needed basis.  Signed,  Yates Decamp, MD, [facility] 05/26/2023, 2:04 PM

## 2023-05-26 ENCOUNTER — Encounter: Payer: Self-pay | Admitting: Cardiology

## 2023-05-31 DIAGNOSIS — E1165 Type 2 diabetes mellitus with hyperglycemia: Secondary | ICD-10-CM | POA: Diagnosis not present

## 2023-06-11 DIAGNOSIS — E1165 Type 2 diabetes mellitus with hyperglycemia: Secondary | ICD-10-CM | POA: Diagnosis not present

## 2023-06-11 DIAGNOSIS — E039 Hypothyroidism, unspecified: Secondary | ICD-10-CM | POA: Diagnosis not present

## 2023-06-13 DIAGNOSIS — M7741 Metatarsalgia, right foot: Secondary | ICD-10-CM | POA: Diagnosis not present

## 2023-06-18 DIAGNOSIS — E782 Mixed hyperlipidemia: Secondary | ICD-10-CM | POA: Diagnosis not present

## 2023-06-18 DIAGNOSIS — E1165 Type 2 diabetes mellitus with hyperglycemia: Secondary | ICD-10-CM | POA: Diagnosis not present

## 2023-06-18 DIAGNOSIS — I10 Essential (primary) hypertension: Secondary | ICD-10-CM | POA: Diagnosis not present

## 2023-06-18 DIAGNOSIS — N182 Chronic kidney disease, stage 2 (mild): Secondary | ICD-10-CM | POA: Diagnosis not present

## 2023-06-21 ENCOUNTER — Other Ambulatory Visit (HOSPITAL_BASED_OUTPATIENT_CLINIC_OR_DEPARTMENT_OTHER): Payer: Self-pay

## 2023-06-21 MED ORDER — MOUNJARO 2.5 MG/0.5ML ~~LOC~~ SOAJ
2.5000 mg | SUBCUTANEOUS | 1 refills | Status: DC
  Filled 2023-06-21: qty 2, 28d supply, fill #0
  Filled 2023-07-15: qty 2, 28d supply, fill #1

## 2023-07-16 DIAGNOSIS — M7741 Metatarsalgia, right foot: Secondary | ICD-10-CM | POA: Diagnosis not present

## 2023-08-09 ENCOUNTER — Other Ambulatory Visit: Payer: Self-pay

## 2023-08-09 DIAGNOSIS — I10 Essential (primary) hypertension: Secondary | ICD-10-CM

## 2023-08-09 DIAGNOSIS — E1165 Type 2 diabetes mellitus with hyperglycemia: Secondary | ICD-10-CM

## 2023-08-09 MED ORDER — EMPAGLIFLOZIN 25 MG PO TABS
25.0000 mg | ORAL_TABLET | Freq: Every day | ORAL | 3 refills | Status: DC
  Filled 2024-05-19: qty 30, 30d supply, fill #0
  Filled 2024-06-26: qty 90, 90d supply, fill #1

## 2023-08-09 MED ORDER — LOSARTAN POTASSIUM-HCTZ 50-12.5 MG PO TABS: 1.0000 | ORAL_TABLET | Freq: Every day | ORAL | 2 refills | Status: AC

## 2023-08-09 NOTE — Telephone Encounter (Signed)
Pt's pharmacy is requesting a refill on empagliflozin 25 mg tablets. We don't refill the 25 mg tablets, only the 10 mg tablets. Would Dr. Jacinto Halim like to refill this strength? Please address

## 2023-08-13 ENCOUNTER — Other Ambulatory Visit (HOSPITAL_BASED_OUTPATIENT_CLINIC_OR_DEPARTMENT_OTHER): Payer: Self-pay

## 2023-08-14 ENCOUNTER — Other Ambulatory Visit (HOSPITAL_BASED_OUTPATIENT_CLINIC_OR_DEPARTMENT_OTHER): Payer: Self-pay

## 2023-08-14 ENCOUNTER — Encounter (HOSPITAL_BASED_OUTPATIENT_CLINIC_OR_DEPARTMENT_OTHER): Payer: Self-pay

## 2023-08-14 MED ORDER — MOUNJARO 2.5 MG/0.5ML ~~LOC~~ SOAJ
2.5000 mg | SUBCUTANEOUS | 1 refills | Status: AC
  Filled 2023-08-14 (×3): qty 2, 28d supply, fill #0
  Filled 2023-09-11: qty 2, 28d supply, fill #1

## 2023-09-17 DIAGNOSIS — N182 Chronic kidney disease, stage 2 (mild): Secondary | ICD-10-CM | POA: Diagnosis not present

## 2023-09-17 DIAGNOSIS — E1165 Type 2 diabetes mellitus with hyperglycemia: Secondary | ICD-10-CM | POA: Diagnosis not present

## 2023-09-17 DIAGNOSIS — E782 Mixed hyperlipidemia: Secondary | ICD-10-CM | POA: Diagnosis not present

## 2023-09-17 DIAGNOSIS — K7581 Nonalcoholic steatohepatitis (NASH): Secondary | ICD-10-CM | POA: Diagnosis not present

## 2023-09-19 ENCOUNTER — Ambulatory Visit: Payer: BC Managed Care – PPO | Admitting: Adult Health

## 2023-09-19 ENCOUNTER — Encounter: Payer: Self-pay | Admitting: Adult Health

## 2023-09-19 VITALS — BP 122/76 | HR 70 | Ht 66.0 in | Wt 206.0 lb

## 2023-09-19 DIAGNOSIS — G4733 Obstructive sleep apnea (adult) (pediatric): Secondary | ICD-10-CM

## 2023-09-19 NOTE — Progress Notes (Signed)
PATIENT: Crystal Green DOB: October 19, 1960  REASON FOR VISIT: follow up HISTORY FROM: patient PRIMARY NEUROLOGIST: Dr. Vickey Huger  Chief Complaint  Patient presents with   Follow-up    Patient in room #19 and alone. Patient states she has been using her CPAP machine due to her having COVID. Patient state she use to cough while on her CPAP machine but now without it she can sleep and dream again.     HISTORY OF PRESENT ILLNESS: Today 09/19/23:  Crystal Green is a 63 y.o. female with a history of OSA on CPAP. Returns today for follow-up.  She reports that she has not been using her CPAP since September of last year.  She states that she got COVID so she stopped using it.  Afterwards she developed a cough while using her CPAP machine.  Since she has not been using her CPAP she states that she is sleeping better reports that she is dreaming again.  No longer has a cough.  She also states that she is lost approximately 45 pounds.  Her last sleep test was in 2019.  I reviewed results of last home sleep test that showed moderate to severe sleep apnea.   09/20/22: Crystal Green is a 63 y.o. female with a history of obstructive sleep apnea on CPAP. Returns today for follow-up.  She reports that the CPAP overall is working well.  She does note that there is been nights that she has been able to use it if she has nasal congestion.  She notices on those nights she does not wake up with swollen ankles but when she uses her CPAP she does.  She also noticed some swelling on the face from her mask.   REVIEW OF SYSTEMS: Out of a complete 14 system review of symptoms, the patient complains only of the following symptoms, and all other reviewed systems are negative.  FSS 13 ESS 2       ALLERGIES: Allergies  Allergen Reactions   Metformin And Related Diarrhea   Sudafed [Pseudoephedrine] Other (See Comments)    jittery   Amoxicillin Rash   Codeine Other (See Comments)    jittery   Penicillins Rash     HOME MEDICATIONS: Outpatient Medications Prior to Visit  Medication Sig Dispense Refill   atorvastatin (LIPITOR) 40 MG tablet TAKE 1 TABLET BY MOUTH DAILY 90 tablet 3   empagliflozin (JARDIANCE) 25 MG TABS tablet Take 1 tablet (25 mg total) by mouth daily. 90 tablet 3   losartan-hydrochlorothiazide (HYZAAR) 50-12.5 MG tablet Take 1 tablet by mouth daily. 90 tablet 2   tirzepatide (MOUNJARO) 2.5 MG/0.5ML Pen Inject 2.5 mg into the skin once a week. 2 mL 1   verapamil (CALAN-SR) 120 MG CR tablet TAKE 1 TABLET BY MOUTH AT  BEDTIME 90 tablet 3   cholestyramine (QUESTRAN) 4 GM/DOSE powder As needed (Patient not taking: Reported on 09/19/2023)  12   No facility-administered medications prior to visit.    PAST MEDICAL HISTORY: Past Medical History:  Diagnosis Date   Chronic kidney disease    stage 3   Hypertension     PAST SURGICAL HISTORY: Past Surgical History:  Procedure Laterality Date   ABDOMINAL HYSTERECTOMY     CATARACT EXTRACTION, BILATERAL  2003   KNEE SURGERY Right 2012   arthroscopic   LAPAROSCOPIC APPENDECTOMY  12/02/2011   Procedure: APPENDECTOMY LAPAROSCOPIC;  Surgeon: Emelia Loron, MD;  Location: [facility] OR;  Service: General;  Laterality: N/A;   TONSILLECTOMY  FAMILY HISTORY: Family History  Problem Relation Age of Onset   Heart disease Mother    Hypertension Mother    Dementia Mother    Heart disease Father    Hypertension Father    Deep vein thrombosis Brother    Sleep apnea Neg Hx    Breast cancer Neg Hx     SOCIAL HISTORY: Social History   Socioeconomic History   Marital status: Married    Spouse name: Iverna Hammac   Number of children: 3   Years of education: Not on file   Highest education level: Not on file  Occupational History   Not on file  Tobacco Use   Smoking status: Never    Passive exposure: Never   Smokeless tobacco: Never  Vaping Use   Vaping status: Never Used  Substance and Sexual Activity   Alcohol use: Yes     Comment: rarely   Drug use: Not Currently   Sexual activity: Not on file  Other Topics Concern   Not on file  Social History Narrative   Not on file   Social Drivers of Health   Financial Resource Strain: Not on file  Food Insecurity: Not on file  Transportation Needs: Not on file  Physical Activity: Not on file  Stress: Not on file  Social Connections: Not on file  Intimate Partner Violence: Not on file      PHYSICAL EXAM  Vitals:   09/19/23 1047  BP: 122/76  Pulse: 70  Weight: 206 lb (93.4 kg)  Height: 5\' 6"  (1.676 m)   Body mass index is 33.25 kg/m.  Generalized: Well developed, in no acute distress  Chest: Lungs clear to auscultation bilaterally  Neurological examination  Mentation: Alert oriented to time, place, history taking. Follows all commands speech and language fluent Cranial nerve II-XII: Extraocular movements were full, visual field were full on confrontational test Head turning and shoulder shrug  were normal and symmetric.    DIAGNOSTIC DATA (LABS, IMAGING, TESTING) - I reviewed patient records, labs, notes, testing and imaging myself where available.  Lab Results  Component Value Date   WBC 13.4 (H) 12/01/2011   HGB 12.8 12/01/2011   HCT 38.3 12/01/2011   MCV 89.5 12/01/2011   PLT 208 12/01/2011      Component Value Date/Time   NA 142 10/04/2021 0952   K 4.1 10/04/2021 0952   CL 106 10/04/2021 0952   CO2 20 10/04/2021 0952   GLUCOSE 164 (H) 10/04/2021 0952   GLUCOSE 122 (H) 12/01/2011 1514   BUN 14 10/04/2021 0952   CREATININE 0.90 10/04/2021 0952   CALCIUM 9.0 10/04/2021 0952   PROT 7.4 12/01/2011 1514   ALBUMIN 4.1 12/01/2011 1514   AST 20 12/01/2011 1514   ALT 15 12/01/2011 1514   ALKPHOS 114 12/01/2011 1514   BILITOT 1.0 12/01/2011 1514   GFRNONAA >90 12/01/2011 1514   GFRAA >90 12/01/2011 1514      ASSESSMENT AND PLAN 63 y.o. year old female  has a past medical history of Chronic kidney disease and Hypertension. here  with:  OSA   -Patient has had a 45 pound weight loss we will repeat home sleep test to evaluate status of sleep apnea. -Once we have the results we will consider what treatment options is appropriate for her. -Follow-up after home sleep test    Butch Penny, MSN, NP-C 09/19/2023, 10:55 AM [facility] [address], [address] [place], [place] [address] [phone]

## 2023-09-19 NOTE — Patient Instructions (Signed)
Repeat home sleep test If your symptoms worsen or you develop new symptoms please let us know.

## 2023-09-24 DIAGNOSIS — E1165 Type 2 diabetes mellitus with hyperglycemia: Secondary | ICD-10-CM | POA: Diagnosis not present

## 2023-09-24 DIAGNOSIS — E782 Mixed hyperlipidemia: Secondary | ICD-10-CM | POA: Diagnosis not present

## 2023-09-24 DIAGNOSIS — N182 Chronic kidney disease, stage 2 (mild): Secondary | ICD-10-CM | POA: Diagnosis not present

## 2023-09-24 DIAGNOSIS — I10 Essential (primary) hypertension: Secondary | ICD-10-CM | POA: Diagnosis not present

## 2023-10-05 ENCOUNTER — Ambulatory Visit: Payer: BC Managed Care – PPO | Admitting: Neurology

## 2023-10-05 DIAGNOSIS — R002 Palpitations: Secondary | ICD-10-CM

## 2023-10-05 DIAGNOSIS — M25472 Effusion, left ankle: Secondary | ICD-10-CM

## 2023-10-05 DIAGNOSIS — G4733 Obstructive sleep apnea (adult) (pediatric): Secondary | ICD-10-CM

## 2023-10-05 DIAGNOSIS — R0683 Snoring: Secondary | ICD-10-CM

## 2023-10-09 DIAGNOSIS — N182 Chronic kidney disease, stage 2 (mild): Secondary | ICD-10-CM | POA: Diagnosis not present

## 2023-10-09 DIAGNOSIS — E1165 Type 2 diabetes mellitus with hyperglycemia: Secondary | ICD-10-CM | POA: Diagnosis not present

## 2023-10-16 DIAGNOSIS — R748 Abnormal levels of other serum enzymes: Secondary | ICD-10-CM | POA: Diagnosis not present

## 2023-10-16 DIAGNOSIS — E782 Mixed hyperlipidemia: Secondary | ICD-10-CM | POA: Diagnosis not present

## 2023-10-16 DIAGNOSIS — E1165 Type 2 diabetes mellitus with hyperglycemia: Secondary | ICD-10-CM | POA: Diagnosis not present

## 2023-10-16 DIAGNOSIS — R1011 Right upper quadrant pain: Secondary | ICD-10-CM | POA: Diagnosis not present

## 2023-10-17 NOTE — Progress Notes (Signed)
 [facility]   HOME SLEEP TEST REPORT ( by Shara Blazing device- mail out)   STUDY DATE:  10-08-2023 DOB:  17-Oct-1960  Crystal Green    ORDERING CLINICIAN: Melvyn Novas, MD  REFERRING CLINICIAN:  Yates Decamp, MD  and Butch Penny, NP    CLINICAL INFORMATION/HISTORY: 09/19/23:   Crystal Green is a 63 y.o. female with a history of OSA on CPAP. Returns today for follow-up.  She reports that she has not been using her CPAP since September of last year.  She states that she got COVID so she stopped using it.  Afterwards she developed a cough while using her CPAP machine.  Since she has not been using her CPAP she states that she is sleeping better reports that she is dreaming again.  No longer has a cough.  She also states that she is lost approximately 45 pounds.  Her last sleep test was in 2019.  I reviewed results of last home sleep test that showed moderate to severe sleep apnea.       Epworth sleepiness score: 2 /24. FSS at 13/ 63 points    BMI:  33.2 kg/m   Neck Circumference: x   FINDINGS:   Sleep Summary:   Total Recording Time (hours, min):     8 hours 44 minutes   Total Sleep Time (hours, min):       7 hours 31 minutes          Sleep latency was 9 minutes and sleep efficiency 86%.                                    Respiratory Indices: Following a 3% desaturation scoring per AASM guidelines.   Calculated pAHI (per hour):     11.8/h with 74 total events.                                       Supine AHI:     This patient slept for the majority of the night in supine position.                                               Oxygen Saturation Statistics:   Oxygen Saturation (%) Mean:         93.7%             O2 Saturation Range (%):      Between a nadir of 81 and a maximum of 100% with a total desaturation time under 89% O2 saturation of only 6 minutes.                                 O2 Saturation (minutes) <89%:     6 minutes      Pulse Rate  Statistics:   Pulse Mean (bpm):   63 bpm              Pulse Range:    Between 53 and 113 bpm.             IMPRESSION:  This HST confirms the presence of mild obstructive sleep  apnea. While the home sleep test device chosen for the study cannot differentiate between REM and non-REM sleep it was clear that the patient's heart rate respiratory rate and oxygen desaturation clustered independent of sleep position at the time between 4 AM and 5:30 AM which is very likely related to REM sleep.    RECOMMENDATION: CPAP therapy  is our first recommendation- As this patient has felt that CPAP is not beneficial for her , I would advise her to sleep in the right or left lateral position rather than supine, elevate the head of bed, and would not expect her to use CPAP therapy if she finds it harder to obtain restful and restorative sleep with the device than without.   Here are the alternatives;   1) Weight loss can certainly benefit and obstructive apnea patient and also decreases the REM sleep apnea component and hopefully snoring.  2) Snoring was noted and this seemed to be related to sleep in supine position.  Again , reducing time in supine sleep can reduce snoring, as can mouth tape or chin strap.   With a still existing diagnosis of obstructive sleep apnea it would be possible for Crystal Green to obtain a dental device should she want to treat snoring specifically -it will also help to reduce the number of apneas further.  A referral to a sleep dentist can be arranged through our office.      INTERPRETING PHYSICIAN:   Melvyn Novas, MD , Valinda Hoar  [facility] [address] [place], [place] [address] [phone]

## 2023-10-24 DIAGNOSIS — R1011 Right upper quadrant pain: Secondary | ICD-10-CM | POA: Diagnosis not present

## 2023-10-24 DIAGNOSIS — K7689 Other specified diseases of liver: Secondary | ICD-10-CM | POA: Diagnosis not present

## 2023-10-24 DIAGNOSIS — K7581 Nonalcoholic steatohepatitis (NASH): Secondary | ICD-10-CM | POA: Diagnosis not present

## 2023-10-24 DIAGNOSIS — R748 Abnormal levels of other serum enzymes: Secondary | ICD-10-CM | POA: Diagnosis not present

## 2023-10-25 ENCOUNTER — Telehealth: Payer: Self-pay | Admitting: Neurology

## 2023-10-25 NOTE — Telephone Encounter (Signed)
 [facility]   HOME SLEEP TEST REPORT ( by Shara Blazing device- mail out)   STUDY DATE:  10-08-2023 DOB:  17-Oct-1960  Crystal Green    ORDERING CLINICIAN: Melvyn Novas, MD  REFERRING CLINICIAN:  Yates Decamp, MD  and Butch Penny, NP    CLINICAL INFORMATION/HISTORY: 09/19/23:   Crystal Green is a 63 y.o. female with a history of OSA on CPAP. Returns today for follow-up.  She reports that she has not been using her CPAP since September of last year.  She states that she got COVID so she stopped using it.  Afterwards she developed a cough while using her CPAP machine.  Since she has not been using her CPAP she states that she is sleeping better reports that she is dreaming again.  No longer has a cough.  She also states that she is lost approximately 45 pounds.  Her last sleep test was in 2019.  I reviewed results of last home sleep test that showed moderate to severe sleep apnea.       Epworth sleepiness score: 2 /24. FSS at 13/ 63 points    BMI:  33.2 kg/m   Neck Circumference: x   FINDINGS:   Sleep Summary:   Total Recording Time (hours, min):     8 hours 44 minutes   Total Sleep Time (hours, min):       7 hours 31 minutes          Sleep latency was 9 minutes and sleep efficiency 86%.                                    Respiratory Indices: Following a 3% desaturation scoring per AASM guidelines.   Calculated pAHI (per hour):     11.8/h with 74 total events.                                       Supine AHI:     This patient slept for the majority of the night in supine position.                                               Oxygen Saturation Statistics:   Oxygen Saturation (%) Mean:         93.7%             O2 Saturation Range (%):      Between a nadir of 81 and a maximum of 100% with a total desaturation time under 89% O2 saturation of only 6 minutes.                                 O2 Saturation (minutes) <89%:     6 minutes      Pulse Rate  Statistics:   Pulse Mean (bpm):   63 bpm              Pulse Range:    Between 53 and 113 bpm.             IMPRESSION:  This HST confirms the presence of mild obstructive sleep  apnea. While the home sleep test device chosen for the study cannot differentiate between REM and non-REM sleep it was clear that the patient's heart rate respiratory rate and oxygen desaturation clustered independent of sleep position at the time between 4 AM and 5:30 AM which is very likely related to REM sleep.    RECOMMENDATION: CPAP therapy  is our first recommendation- As this patient has felt that CPAP is not beneficial for her , I would advise her to sleep in the right or left lateral position rather than supine, elevate the head of bed, and would not expect her to use CPAP therapy if she finds it harder to obtain restful and restorative sleep with the device than without.   Here are the alternatives;   1) Weight loss can certainly benefit and obstructive apnea patient and also decreases the REM sleep apnea component and hopefully snoring.  2) Snoring was noted and this seemed to be related to sleep in supine position.  Again , reducing time in supine sleep can reduce snoring, as can mouth tape or chin strap.   With a still existing diagnosis of obstructive sleep apnea it would be possible for [name] to obtain a dental device should she want to treat snoring specifically -it will also help to reduce the number of apneas further.  A referral to a sleep dentist can be arranged through our office.      INTERPRETING PHYSICIAN:   Melvyn Novas, MD , Valinda Hoar  [facility] [address] [place], [place] [address] [phone]

## 2023-10-25 NOTE — Procedures (Signed)
 [facility]   HOME SLEEP TEST REPORT ( by Shara Blazing device- mail out)   STUDY DATE:  10-08-2023 DOB:  17-Oct-1960  Crystal Green    ORDERING CLINICIAN: Melvyn Novas, MD  REFERRING CLINICIAN:  Yates Decamp, MD  and Butch Penny, NP    CLINICAL INFORMATION/HISTORY: 09/19/23:   Crystal Green is a 63 y.o. female with a history of OSA on CPAP. Returns today for follow-up.  She reports that she has not been using her CPAP since September of last year.  She states that she got COVID so she stopped using it.  Afterwards she developed a cough while using her CPAP machine.  Since she has not been using her CPAP she states that she is sleeping better reports that she is dreaming again.  No longer has a cough.  She also states that she is lost approximately 45 pounds.  Her last sleep test was in 2019.  I reviewed results of last home sleep test that showed moderate to severe sleep apnea.       Epworth sleepiness score: 2 /24. FSS at 13/ 63 points    BMI:  33.2 kg/m   Neck Circumference: x   FINDINGS:   Sleep Summary:   Total Recording Time (hours, min):     8 hours 44 minutes   Total Sleep Time (hours, min):       7 hours 31 minutes          Sleep latency was 9 minutes and sleep efficiency 86%.                                    Respiratory Indices: Following a 3% desaturation scoring per AASM guidelines.   Calculated pAHI (per hour):     11.8/h with 74 total events.                                       Supine AHI:     This patient slept for the majority of the night in supine position.                                               Oxygen Saturation Statistics:   Oxygen Saturation (%) Mean:         93.7%             O2 Saturation Range (%):      Between a nadir of 81 and a maximum of 100% with a total desaturation time under 89% O2 saturation of only 6 minutes.                                 O2 Saturation (minutes) <89%:     6 minutes      Pulse Rate  Statistics:   Pulse Mean (bpm):   63 bpm              Pulse Range:    Between 53 and 113 bpm.             IMPRESSION:  This HST confirms the presence of mild obstructive sleep  apnea. While the home sleep test device chosen for the study cannot differentiate between REM and non-REM sleep it was clear that the patient's heart rate respiratory rate and oxygen desaturation clustered independent of sleep position at the time between 4 AM and 5:30 AM which is very likely related to REM sleep.    RECOMMENDATION: CPAP therapy  is our first recommendation- As this patient has felt that CPAP is not beneficial for her , I would advise her to sleep in the right or left lateral position rather than supine, elevate the head of bed, and would not expect her to use CPAP therapy if she finds it harder to obtain restful and restorative sleep with the device than without.   Here are the alternatives;   1) Weight loss can certainly benefit and obstructive apnea patient and also decreases the REM sleep apnea component and hopefully snoring.  2) Snoring was noted and this seemed to be related to sleep in supine position.  Again , reducing time in supine sleep can reduce snoring, as can mouth tape or chin strap.   With a still existing diagnosis of obstructive sleep apnea it would be possible for Crystal Green to obtain a dental device should she want to treat snoring specifically -it will also help to reduce the number of apneas further.  A referral to a sleep dentist can be arranged through our office.      INTERPRETING PHYSICIAN:   Melvyn Novas, MD , Valinda Hoar  [facility] [address] [place], [place] [address] [phone]

## 2023-10-29 ENCOUNTER — Telehealth: Payer: Self-pay | Admitting: Adult Health

## 2023-10-29 NOTE — Telephone Encounter (Signed)
 Pt returned call.  I gave her the results per MM/NP and Dr. Golden Hurter. Recommended CPAP for mile OSA but if not able to tolerate then other options:  sleeping on left and right sides.  Oral dental device by a sleep dentist, and weight loss.  Her OSA decreased due to weight loss.  She has had issues with mask, (causing respiratory issuses,  nasal pillows (bleeding).  She uses a retainer nightly, but I told her dental devices are used specific for OSA (by sleep dentist,, Dr. Toni Arthurs, Dr. Myrtis Ser).  She was going to ask her dentist.  She verbalized understanding.  Will call if questions.

## 2023-10-29 NOTE — Telephone Encounter (Signed)
-----   Message from Butch Penny sent at 10/29/2023 12:25 PM EDT ----- Please call patient. Let her know she does have mild sleep apnea. We recommend treatment with cpap unless she is unable to tolerate then we can discuss options listed in the sleep study result

## 2023-12-17 DIAGNOSIS — L659 Nonscarring hair loss, unspecified: Secondary | ICD-10-CM | POA: Diagnosis not present

## 2023-12-17 DIAGNOSIS — F430 Acute stress reaction: Secondary | ICD-10-CM | POA: Diagnosis not present

## 2023-12-17 DIAGNOSIS — K5900 Constipation, unspecified: Secondary | ICD-10-CM | POA: Diagnosis not present

## 2023-12-17 DIAGNOSIS — K649 Unspecified hemorrhoids: Secondary | ICD-10-CM | POA: Diagnosis not present

## 2024-01-02 ENCOUNTER — Other Ambulatory Visit: Payer: Self-pay | Admitting: Cardiology

## 2024-02-25 DIAGNOSIS — E039 Hypothyroidism, unspecified: Secondary | ICD-10-CM | POA: Diagnosis not present

## 2024-02-25 DIAGNOSIS — R7303 Prediabetes: Secondary | ICD-10-CM | POA: Diagnosis not present

## 2024-02-25 DIAGNOSIS — I10 Essential (primary) hypertension: Secondary | ICD-10-CM | POA: Diagnosis not present

## 2024-03-03 DIAGNOSIS — E782 Mixed hyperlipidemia: Secondary | ICD-10-CM | POA: Diagnosis not present

## 2024-03-03 DIAGNOSIS — E1165 Type 2 diabetes mellitus with hyperglycemia: Secondary | ICD-10-CM | POA: Diagnosis not present

## 2024-03-03 DIAGNOSIS — I10 Essential (primary) hypertension: Secondary | ICD-10-CM | POA: Diagnosis not present

## 2024-03-04 ENCOUNTER — Other Ambulatory Visit (HOSPITAL_BASED_OUTPATIENT_CLINIC_OR_DEPARTMENT_OTHER): Payer: Self-pay

## 2024-03-04 ENCOUNTER — Other Ambulatory Visit (HOSPITAL_COMMUNITY): Payer: Self-pay

## 2024-03-04 MED ORDER — MOUNJARO 5 MG/0.5ML ~~LOC~~ SOAJ
5.0000 mg | SUBCUTANEOUS | 3 refills | Status: AC
Start: 2024-03-03 — End: ?
  Filled 2024-03-04 – 2024-03-05 (×2): qty 2, 28d supply, fill #0

## 2024-03-05 ENCOUNTER — Other Ambulatory Visit (HOSPITAL_BASED_OUTPATIENT_CLINIC_OR_DEPARTMENT_OTHER): Payer: Self-pay

## 2024-03-05 ENCOUNTER — Other Ambulatory Visit (HOSPITAL_COMMUNITY): Payer: Self-pay

## 2024-03-06 ENCOUNTER — Other Ambulatory Visit (HOSPITAL_COMMUNITY): Payer: Self-pay

## 2024-03-08 ENCOUNTER — Other Ambulatory Visit (HOSPITAL_COMMUNITY): Payer: Self-pay

## 2024-04-01 ENCOUNTER — Other Ambulatory Visit (HOSPITAL_BASED_OUTPATIENT_CLINIC_OR_DEPARTMENT_OTHER): Payer: Self-pay

## 2024-04-01 MED ORDER — MOUNJARO 5 MG/0.5ML ~~LOC~~ SOAJ
SUBCUTANEOUS | 1 refills | Status: AC
  Filled 2024-04-01: qty 6, 90d supply, fill #0
  Filled 2024-04-03 (×5): qty 6, 84d supply, fill #0
  Filled 2024-06-26: qty 6, 84d supply, fill #1

## 2024-04-03 ENCOUNTER — Other Ambulatory Visit (HOSPITAL_BASED_OUTPATIENT_CLINIC_OR_DEPARTMENT_OTHER): Payer: Self-pay

## 2024-04-03 ENCOUNTER — Other Ambulatory Visit (HOSPITAL_COMMUNITY): Payer: Self-pay

## 2024-04-04 ENCOUNTER — Other Ambulatory Visit (HOSPITAL_COMMUNITY): Payer: Self-pay

## 2024-04-15 DIAGNOSIS — I10 Essential (primary) hypertension: Secondary | ICD-10-CM | POA: Diagnosis not present

## 2024-04-15 DIAGNOSIS — E1165 Type 2 diabetes mellitus with hyperglycemia: Secondary | ICD-10-CM | POA: Diagnosis not present

## 2024-04-15 DIAGNOSIS — N182 Chronic kidney disease, stage 2 (mild): Secondary | ICD-10-CM | POA: Diagnosis not present

## 2024-04-15 DIAGNOSIS — E782 Mixed hyperlipidemia: Secondary | ICD-10-CM | POA: Diagnosis not present

## 2024-04-15 DIAGNOSIS — E039 Hypothyroidism, unspecified: Secondary | ICD-10-CM | POA: Diagnosis not present

## 2024-04-15 DIAGNOSIS — K7581 Nonalcoholic steatohepatitis (NASH): Secondary | ICD-10-CM | POA: Diagnosis not present

## 2024-04-22 DIAGNOSIS — E782 Mixed hyperlipidemia: Secondary | ICD-10-CM | POA: Diagnosis not present

## 2024-04-22 DIAGNOSIS — E1165 Type 2 diabetes mellitus with hyperglycemia: Secondary | ICD-10-CM | POA: Diagnosis not present

## 2024-04-22 DIAGNOSIS — Z0000 Encounter for general adult medical examination without abnormal findings: Secondary | ICD-10-CM | POA: Diagnosis not present

## 2024-04-22 DIAGNOSIS — J0100 Acute maxillary sinusitis, unspecified: Secondary | ICD-10-CM | POA: Diagnosis not present

## 2024-05-19 ENCOUNTER — Other Ambulatory Visit (HOSPITAL_BASED_OUTPATIENT_CLINIC_OR_DEPARTMENT_OTHER): Payer: Self-pay

## 2024-05-20 ENCOUNTER — Other Ambulatory Visit (HOSPITAL_BASED_OUTPATIENT_CLINIC_OR_DEPARTMENT_OTHER): Payer: Self-pay

## 2024-05-21 ENCOUNTER — Other Ambulatory Visit (HOSPITAL_COMMUNITY): Payer: Self-pay

## 2024-05-27 DIAGNOSIS — N3946 Mixed incontinence: Secondary | ICD-10-CM | POA: Diagnosis not present

## 2024-05-27 DIAGNOSIS — N819 Female genital prolapse, unspecified: Secondary | ICD-10-CM | POA: Diagnosis not present

## 2024-05-27 DIAGNOSIS — R1031 Right lower quadrant pain: Secondary | ICD-10-CM | POA: Diagnosis not present

## 2024-06-26 ENCOUNTER — Other Ambulatory Visit (HOSPITAL_BASED_OUTPATIENT_CLINIC_OR_DEPARTMENT_OTHER): Payer: Self-pay

## 2024-09-02 ENCOUNTER — Other Ambulatory Visit (HOSPITAL_BASED_OUTPATIENT_CLINIC_OR_DEPARTMENT_OTHER): Payer: Self-pay

## 2024-09-02 ENCOUNTER — Other Ambulatory Visit: Payer: Self-pay

## 2024-09-02 MED ORDER — MOUNJARO 7.5 MG/0.5ML ~~LOC~~ SOAJ
7.5000 mg | SUBCUTANEOUS | 1 refills | Status: AC
  Filled 2024-09-02 – 2024-09-18 (×2): qty 2, 28d supply, fill #0

## 2024-09-03 ENCOUNTER — Other Ambulatory Visit (HOSPITAL_BASED_OUTPATIENT_CLINIC_OR_DEPARTMENT_OTHER): Payer: Self-pay

## 2024-09-12 ENCOUNTER — Other Ambulatory Visit (HOSPITAL_BASED_OUTPATIENT_CLINIC_OR_DEPARTMENT_OTHER): Payer: Self-pay

## 2024-09-16 ENCOUNTER — Other Ambulatory Visit (HOSPITAL_BASED_OUTPATIENT_CLINIC_OR_DEPARTMENT_OTHER): Payer: Self-pay

## 2024-09-18 ENCOUNTER — Other Ambulatory Visit: Payer: Self-pay

## 2024-09-18 ENCOUNTER — Other Ambulatory Visit (HOSPITAL_BASED_OUTPATIENT_CLINIC_OR_DEPARTMENT_OTHER): Payer: Self-pay

## 2024-09-24 ENCOUNTER — Other Ambulatory Visit: Payer: Self-pay | Admitting: Cardiology

## 2024-09-24 ENCOUNTER — Other Ambulatory Visit: Payer: Self-pay

## 2024-09-24 ENCOUNTER — Other Ambulatory Visit (HOSPITAL_BASED_OUTPATIENT_CLINIC_OR_DEPARTMENT_OTHER): Payer: Self-pay

## 2024-09-24 DIAGNOSIS — E1165 Type 2 diabetes mellitus with hyperglycemia: Secondary | ICD-10-CM

## 2024-09-24 MED ORDER — JARDIANCE 25 MG PO TABS
25.0000 mg | ORAL_TABLET | Freq: Every morning | ORAL | 1 refills | Status: AC
  Filled 2024-09-24: qty 90, 90d supply, fill #0
